# Patient Record
Sex: Female | Born: 1985 | Race: White | Hispanic: No | Smoking: Current every day smoker
Health system: Southern US, Community
[De-identification: ages and names within clinical notes are randomized; demographics above are authoritative.]

## PROBLEM LIST (undated history)

## (undated) DIAGNOSIS — J449 Chronic obstructive pulmonary disease, unspecified: Secondary | ICD-10-CM

## (undated) DIAGNOSIS — F329 Major depressive disorder, single episode, unspecified: Secondary | ICD-10-CM

## (undated) DIAGNOSIS — T07XXXA Unspecified multiple injuries, initial encounter: Secondary | ICD-10-CM

## (undated) DIAGNOSIS — F99 Mental disorder, not otherwise specified: Secondary | ICD-10-CM

## (undated) DIAGNOSIS — F32A Depression, unspecified: Secondary | ICD-10-CM

## (undated) DIAGNOSIS — K2970 Gastritis, unspecified, without bleeding: Secondary | ICD-10-CM

## (undated) DIAGNOSIS — F1020 Alcohol dependence, uncomplicated: Secondary | ICD-10-CM

## (undated) HISTORY — PX: CATARACT EXTRACTION: SUR2

## (undated) HISTORY — DX: Mental disorder, not otherwise specified: F99

## (undated) HISTORY — PX: EYE SURGERY: SHX253

## (undated) HISTORY — DX: Chronic obstructive pulmonary disease, unspecified: J44.9

## (undated) HISTORY — DX: Unspecified multiple injuries, initial encounter: T07.XXXA

## (undated) HISTORY — DX: Major depressive disorder, single episode, unspecified: F32.9

## (undated) HISTORY — DX: Alcohol dependence, uncomplicated: F10.20

## (undated) HISTORY — DX: Depression, unspecified: F32.A

---
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
## 2005-02-23 ENCOUNTER — Ambulatory Visit (HOSPITAL_COMMUNITY): Admission: RE | Admit: 2005-02-23 | Discharge: 2005-02-23 | Payer: Self-pay | Admitting: Obstetrics and Gynecology

## 2005-06-30 ENCOUNTER — Inpatient Hospital Stay (HOSPITAL_COMMUNITY): Admission: AD | Admit: 2005-06-30 | Discharge: 2005-07-03 | Payer: Self-pay | Admitting: Obstetrics and Gynecology

## 2008-01-02 ENCOUNTER — Other Ambulatory Visit: Admission: RE | Admit: 2008-01-02 | Discharge: 2008-01-02 | Payer: Self-pay | Admitting: Obstetrics & Gynecology

## 2008-04-02 ENCOUNTER — Ambulatory Visit: Payer: Self-pay | Admitting: Family Medicine

## 2008-04-02 ENCOUNTER — Inpatient Hospital Stay (HOSPITAL_COMMUNITY): Admission: AD | Admit: 2008-04-02 | Discharge: 2008-04-04 | Payer: Self-pay | Admitting: Obstetrics & Gynecology

## 2009-09-18 ENCOUNTER — Emergency Department (HOSPITAL_COMMUNITY): Admission: EM | Admit: 2009-09-18 | Discharge: 2009-09-18 | Payer: Self-pay | Admitting: Emergency Medicine

## 2009-10-26 ENCOUNTER — Emergency Department (HOSPITAL_COMMUNITY): Admission: EM | Admit: 2009-10-26 | Discharge: 2009-10-27 | Payer: Self-pay | Admitting: Emergency Medicine

## 2009-11-01 ENCOUNTER — Emergency Department (HOSPITAL_COMMUNITY): Admission: EM | Admit: 2009-11-01 | Discharge: 2009-11-01 | Payer: Self-pay | Admitting: Emergency Medicine

## 2010-05-08 ENCOUNTER — Emergency Department (HOSPITAL_COMMUNITY): Admission: EM | Admit: 2010-05-08 | Discharge: 2010-05-08 | Payer: Self-pay | Admitting: Emergency Medicine

## 2010-06-23 ENCOUNTER — Emergency Department (HOSPITAL_COMMUNITY): Admission: EM | Admit: 2010-06-23 | Discharge: 2010-06-23 | Payer: Self-pay | Admitting: Emergency Medicine

## 2010-08-29 ENCOUNTER — Emergency Department (HOSPITAL_COMMUNITY)
Admission: EM | Admit: 2010-08-29 | Discharge: 2010-08-29 | Disposition: A | Discharge status: Home / Self Care | Attending: Emergency Medicine | Admitting: Emergency Medicine

## 2010-08-29 ENCOUNTER — Emergency Department (HOSPITAL_COMMUNITY): Admit: 2010-08-29 | Discharge: 2010-08-29 | Disposition: A | Discharge status: Home / Self Care

## 2010-08-29 DIAGNOSIS — F3130 Bipolar disorder, current episode depressed, mild or moderate severity, unspecified: Secondary | ICD-10-CM | POA: Insufficient documentation

## 2010-08-29 DIAGNOSIS — Y92009 Unspecified place in unspecified non-institutional (private) residence as the place of occurrence of the external cause: Secondary | ICD-10-CM | POA: Insufficient documentation

## 2010-08-29 DIAGNOSIS — S60229A Contusion of unspecified hand, initial encounter: Secondary | ICD-10-CM | POA: Insufficient documentation

## 2010-08-29 DIAGNOSIS — W2209XA Striking against other stationary object, initial encounter: Secondary | ICD-10-CM | POA: Insufficient documentation

## 2010-08-29 DIAGNOSIS — S6990XA Unspecified injury of unspecified wrist, hand and finger(s), initial encounter: Secondary | ICD-10-CM | POA: Insufficient documentation

## 2010-08-29 DIAGNOSIS — IMO0002 Reserved for concepts with insufficient information to code with codable children: Secondary | ICD-10-CM | POA: Insufficient documentation

## 2010-08-29 DIAGNOSIS — Z79899 Other long term (current) drug therapy: Secondary | ICD-10-CM | POA: Insufficient documentation

## 2010-08-29 DIAGNOSIS — S59909A Unspecified injury of unspecified elbow, initial encounter: Secondary | ICD-10-CM | POA: Insufficient documentation

## 2010-08-29 DIAGNOSIS — M25539 Pain in unspecified wrist: Secondary | ICD-10-CM | POA: Insufficient documentation
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
## 2010-12-11 NOTE — Discharge Summary (Signed)
[facility]  Patient:    Alison Johnson, Alison Johnson Visit Number: [id number] MRN: [id number]          Service Type: PSY Location: 100 0105 02 Attending Physician:  Veneta Penton Dictated by:   Elfredia Nevins, M.D. Admit Date:  06/01/2001 Discharge Date: 06/06/2001                             Discharge Summary  DISCHARGE DIAGNOSIS:  Intentional overdose, nontoxic.  SUMMARY:  The patient was discharged to psychiatric inpatient care after an intentional overdose of calcium channel blocker, Synthroid and aleve as well as ibuprofen.  She had no toxic metabolic sequelae, and had prompt gastric decontamination on arrival to the emergency department within 1 to 2 hours of ingestion.  She was observed in the ICU overnight with serial assessments stable.  DISPOSITION:  To psychiatry as above. Dictated by:   Elfredia Nevins, M.D. Attending Physician:  Veneta Penton DD:  06/13/01 TD:  06/13/01 Job: [id number]

## 2010-12-11 NOTE — Group Therapy Note (Signed)
NAMEMAKYLAH, BOSSARD                ACCOUNT NO.:  [id number]   MEDICAL RECORD NO.:  [id number]          PATIENT TYPE:  INP   LOCATION:  LDR1                          FACILITY:  [facility]   PHYSICIAN:  Tilda Burrow, M.D. DATE OF BIRTH:  November 16, 1985   DATE OF PROCEDURE:  07/01/2005  DATE OF DISCHARGE:                                   PROGRESS NOTE   HISTORY OF PRESENT ILLNESS:  Length of first stage of labor 6 hours and 40  minutes.  Length of second stage of labor 30 minutes.  Length of third stage  labor 15 minutes.   DELIVERY NOTE:  Othel had a vacuum assisted vaginal delivery of a viable  female infant with Apgars of 9 and 9 for prolonged fetal bradycardia and  decelerations.  At a crowning station, a KIWI was placed.  Local infiltrate  given and midline episiotomy cut and vacuum assisted to the green marker on  the KIWI vac times one uterine contraction with spontaneous delivery of  head.  Upon delivery of infant, it was thoroughly suctioned, dried, cord cut  and clamped and to the mother's abdomen for newborn care.  The crown was  strong.  Color pinked well.  Movement of all extremity was noted.  Following  inspection, there is a third degree extension from the midline episiotomy.  Dr. Emelda Fear was in attendance.  Third stage of labor was actually managed  with 20 units Pitocin and 7 cc of D-5 Dilaudid at rapid rate.  Placenta was  delivered spontaneously via Shultze mechanism.  Estimated blood loss was  approximately 400 cc.  Third degree laceration was repaired by Dr. Emelda Fear  using 3-0 Vicryl and 1% lidocaine with local infiltrate.  Patient tolerated  procedure well.  Infant and mother stabilized and transferred out to  postpartum unit in stable condition.      Zerita Boers, Lanier Clam      Tilda Burrow, M.D.  Electronically Signed    DL/MEDQ  D:  05/22/2535  T:  07/01/2005  Job:  [id number]   cc:   [facility]   Francoise Schaumann. Halford Chessman  Fax: [phone]

## 2010-12-11 NOTE — Discharge Summary (Signed)
[facility]  Patient:    Alison Johnson, Alison Johnson Visit Number: [id number] MRN: [id number]          Service Type: PSY Location: 100 0105 02 Attending Physician:  Veneta Penton. Dictated by:   Veneta Penton, M.D. Admit Date:  06/01/2001 Disc. Date: 06/06/01                             Discharge Summary  REASON FOR ADMISSION:  This 25 year old white female was admitted complaining of depression status post suicide attempt via drug overdose with her mothers pills.  For further history of present illness, please see the patients psychiatric admission assessment.  PHYSICAL EXAMINATION:  At the time of admission was unremarkable with the exception of a history of removal of a cataract in her right eye and a strabismus repair as a young child.  LABORATORY EXAMINATION:  The patient underwent a laboratory work-up to rule out any medical problems contributing to her symptomatology.  A urine probe for gonorrhea and chlamydia was negative for gonorrhea and positive for chlamydia.  The patient was treated with a gram of Zithromax p.o. x 1.  Her TSH was 0.694.  Free T4 was 1.53. A UA showed small amount of hemoglobin consistent with the patient being on her menstrual period.  A routine chem panel was within normal limits.  A GGT was within normal limits.  Hepatic panel was within normal limits.  An RPR was nonreactive.  CBC showed a hemoglobin of 11.8, hematocrit of 33.5, white blood cell count of 13,000, platelet count of 429,000, and was otherwise unremarkable.  Urine pregnancy test was negative.  Salicylate level was undetectable.  Blood alcohol level undetectable.  Acetaminophen level was subtherapeutic.  A routine drug screen was negative.  HOSPITAL COURSE:  The patient on admission was psychomotor retarded with a flat affect.  Mood was depressed, anxious, irritable and angry.  Her concentration was decreased.  She showed poor impulse control.  Her  speech was coherent with a decreased rate and volume of speech and increased speech latency.  She was begun on a trial of Effexor XR and titrated up to a therapeutic range, in combination with Remeron soltabs due to extreme difficulty with sleep.  She tolerated these medications well without side effects.  At the time of discharge, she denies any homicidal or suicidal ideation.  Her affect and mood have improved.  She no longer appears to be a danger to herself or others.  She is participating in all aspects of the therapeutic treatment program and consequently is felt to have reached her maximum benefits of hospitalization and is ready for discharge to a less restricted alternative setting.  CONDITION ON DISCHARGE:  Improved  FINAL DIAGNOSIS: Axis I:    1. Major depression, single episode, severe, without psychosis.            2. Oppositional-defiant disorder.            3. Nicotine dependence.            4. Cannabis abuse by history.            5. Rule out polysubstance abuse and dependence. Axis II:   Rule out learning disorder not otherwise specified. Axis III:  Chlamydia (treated). Axis IV:   Current psychosocial stressors are severe. Axis V:    Code 20 on admission, code 30 on discharge.  FURTHER EVALUATION AND TREATMENT RECOMMENDATIONS: 1. The patient is  discharged to home. 2. She is discharged on an unrestricted level of activity and a regular diet. 3. She will follow up with her outpatient psychiatrist for all further aspects    of her psychiatric care.  As she will be following up with her outpatient    psychiatrist I will sign off on the case at this time. DISCHARGE MEDICATIONS: 1. Remeron soltabs 30 mg p.o. q.h.s. 2. Effexor XR 37.5 mg in the morning with food for 3 days, then increasing to    75 mg p.o. q.a.m. with food. Dictated by:   Veneta Penton, M.D. Attending Physician:  Veneta Penton DD:  06/06/01 TD:  06/06/01 Job: [id number]

## 2010-12-11 NOTE — H&P (Signed)
Alison Johnson, Alison Johnson                ACCOUNT NO.:  [id number]   MEDICAL RECORD NO.:  [id number]          PATIENT TYPE:  INP   LOCATION:  LDR1                          FACILITY:  [facility]   PHYSICIAN:  Tilda Burrow, M.D. DATE OF BIRTH:  15-Jun-1986   DATE OF ADMISSION:  06/30/2005  DATE OF DISCHARGE:  LH                                HISTORY & PHYSICAL   REASON FOR ADMISSION:  Pregnancy at 40 weeks and 5 days for induction of  labor for post dates.   HISTORY OF PRESENT ILLNESS:  Alison Johnson is a 25 year old gravida 1, para 0, EDC  June 25, 2005, who is in today for her routine office visit.  After  careful discussion in regards to risks and benefits of induction of labor  versus continuing the pregnancy until she went into labor, the patient and  practitioner both feel it would be in her best interest for induction of  labor.   PAST MEDICAL HISTORY:  Depression.   PAST SURGICAL HISTORY:  Right eye:  Congenital cataract.   FAMILY HISTORY:  She lives with her significant other.   PRENATAL COURSE:  Essentially uneventful.  Blood type O negative, rubella  immune, hepatitis B surface antigen negative, HIV nonreactive, HPV negative.  CHARGE nonreactive.  Pap normal.  GC/Chlamydia are negative.  AFP normal.  QBS is positive.  Repeat GC/Chlamydia at 36 weeks was negative.  A 28-week  hemoglobin 11.8, hematocrit 36.3.  One hour glucose was 91.   PHYSICAL EXAMINATION:  VITAL SIGNS:  Weight 132, blood pressure 110/60.  ABDOMEN:  There is good fetal movement noted per patient and practitioner.  Fundal height is 37 cm.  Fetal heart rate is 120-130.  Vertex presentation  is noted.  Cervix is 1-2 cm dilated, 8% and -1.   PLAN:  We are going to admit for Foley bulb induction of labor.      Zerita Boers, Lanier Clam      Tilda Burrow, M.D.  Electronically Signed    DL/MEDQ  D:  16/04/9603  T:  06/30/2005  Job:  [id number]   cc:   Francoise Schaumann. Halford Chessman  Fax: [phone]

## 2010-12-11 NOTE — H&P (Signed)
[facility]  Patient:    Alison Johnson, Alison Johnson Visit Number: [id number] MRN: [id number]          Service Type: PSY Location: 100 0105 02 Attending Physician:  Veneta Penton Dictated by:   Elfredia Nevins, M.D. Admit Date:  06/01/2001                           History and Physical  DATE OF BIRTH:  1986/02/20  CHIEF COMPLAINT:  Intentional overdose.  HISTORY OF PRESENT ILLNESS:  The patient presented to the emergency department within 15 minutes to 30 minutes, certainly inside of an hour, of ingesting some Synthroid, calcium-channel blocker, and Aleve, as well as ibuprofen.  She was under some situational stress which precipitated this episode.  She denied any homicidal ideation.  She has no previous medical problems but has had a previous psychiatric history of depression, maintained on Celexa and Atarax p.r.n.  SOCIAL HISTORY:  She is cigarette smoker positive.  Occasional alcohol use. Denies any other drug use.  She was noted to arrive by private car after she told her parents of her gesture and ingestion.  FAMILY HISTORY:  Noncontributory.  REVIEW OF SYSTEMS:  As under HPI.  Last menstrual period was two weeks ago, on time, and she uses condoms for prophylaxis.  PHYSICAL EXAMINATION:  GENERAL:  Awake, alert, tearful, and anxious.  HEENT, NECK:  No JVD or adenopathy.  Neck supple.  CHEST:  Clear.  CARDIAC:  Regular rhythm without murmur, gallop, or rub.  ABDOMEN:  Soft.  No organomegaly or mass.  EXTREMITIES:  Without clubbing, cyanosis, or edema.  NEUROLOGIC:  Normal.  LABORATORY DATA:  Negative urine drug screen.  Nontoxic aspirin and Tylenol levels.  Thyroid panel pending at the present time.  PLAN:  She is to be placed in the intensive care unit for observation ______, seizure and suicide precautions and will be seen in consultation by [facility] after medical clearance is established.Dictated by:   Elfredia Nevins, M.D. Attending Physician:  Veneta Penton DD:  06/01/01 TD:  06/01/01 Job: [id number]

## 2011-03-14 ENCOUNTER — Emergency Department (HOSPITAL_COMMUNITY)
Admission: EM | Admit: 2011-03-14 | Discharge: 2011-03-14 | Disposition: A | Discharge status: Home / Self Care | Attending: Emergency Medicine | Admitting: Emergency Medicine

## 2011-03-14 ENCOUNTER — Emergency Department (HOSPITAL_COMMUNITY)

## 2011-03-14 ENCOUNTER — Encounter: Payer: Self-pay | Admitting: *Deleted

## 2011-03-14 DIAGNOSIS — H209 Unspecified iridocyclitis: Secondary | ICD-10-CM

## 2011-03-14 DIAGNOSIS — H5710 Ocular pain, unspecified eye: Secondary | ICD-10-CM | POA: Insufficient documentation

## 2011-03-14 MED ORDER — ATROPINE SULFATE 1 % OP SOLN
1.0000 [drp] | Freq: Once | OPHTHALMIC | Status: AC
  Administered 2011-03-14: 1 [drp] via OPHTHALMIC
  Filled 2011-03-14: qty 2

## 2011-03-14 MED ORDER — KETOROLAC TROMETHAMINE 0.5 % OP SOLN
1.0000 [drp] | Freq: Once | OPHTHALMIC | Status: AC
  Administered 2011-03-14: 1 [drp] via OPHTHALMIC
  Filled 2011-03-14: qty 5

## 2011-03-14 MED ORDER — TETRACAINE HCL 0.5 % OP SOLN
2.0000 [drp] | Freq: Once | OPHTHALMIC | Status: AC
  Administered 2011-03-14: 2 [drp] via OPHTHALMIC
  Filled 2011-03-14: qty 2

## 2011-03-14 MED ORDER — ATROPINE SULFATE 1 % OP OINT: TOPICAL_OINTMENT | Freq: Once | OPHTHALMIC | Status: DC

## 2011-03-14 MED ORDER — HYDROCODONE-ACETAMINOPHEN 5-325 MG PO TABS: 1.0000 | ORAL_TABLET | ORAL | Status: AC | PRN
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
## 2011-03-14 NOTE — ED Notes (Signed)
Per Burgess Amor, PA - d/c pt with Atropine drops and given 1 drop to right eye tomorrow evening.  Pt verbalized understanding.

## 2011-04-28 LAB — RPR: RPR Ser Ql: NONREACTIVE

## 2011-04-28 LAB — CBC
HCT: 40.7
Hemoglobin: 11.9 — ABNORMAL LOW
MCHC: 33.9
MCV: 99.7
Platelets: 269
RBC: 3.48 — ABNORMAL LOW
RBC: 4.08
RDW: 13.2
RDW: 13.3

## 2011-04-28 LAB — RH IMMUNE GLOB WKUP(>/=20WKS)(NOT WOMEN'S HOSP): Fetal Screen: NEGATIVE

## 2017-08-05 ENCOUNTER — Ambulatory Visit: Admitting: Family Medicine

## 2017-09-04 ENCOUNTER — Encounter (HOSPITAL_COMMUNITY): Payer: Self-pay | Admitting: Emergency Medicine

## 2017-09-04 ENCOUNTER — Emergency Department (HOSPITAL_COMMUNITY)
Admission: EM | Admit: 2017-09-04 | Discharge: 2017-09-04 | Disposition: A | Discharge status: Home / Self Care | Payer: Medicaid Other | Attending: Emergency Medicine | Admitting: Emergency Medicine

## 2017-09-04 ENCOUNTER — Emergency Department (HOSPITAL_COMMUNITY): Payer: Medicaid Other

## 2017-09-04 ENCOUNTER — Other Ambulatory Visit: Payer: Self-pay

## 2017-09-04 DIAGNOSIS — S42294A Other nondisplaced fracture of upper end of right humerus, initial encounter for closed fracture: Secondary | ICD-10-CM | POA: Insufficient documentation

## 2017-09-04 DIAGNOSIS — Z79899 Other long term (current) drug therapy: Secondary | ICD-10-CM | POA: Insufficient documentation

## 2017-09-04 DIAGNOSIS — Y929 Unspecified place or not applicable: Secondary | ICD-10-CM | POA: Insufficient documentation

## 2017-09-04 DIAGNOSIS — F17210 Nicotine dependence, cigarettes, uncomplicated: Secondary | ICD-10-CM | POA: Diagnosis not present

## 2017-09-04 DIAGNOSIS — S4991XA Unspecified injury of right shoulder and upper arm, initial encounter: Secondary | ICD-10-CM | POA: Diagnosis present

## 2017-09-04 DIAGNOSIS — Y999 Unspecified external cause status: Secondary | ICD-10-CM | POA: Diagnosis not present

## 2017-09-04 DIAGNOSIS — W08XXXA Fall from other furniture, initial encounter: Secondary | ICD-10-CM | POA: Insufficient documentation

## 2017-09-04 DIAGNOSIS — Y939 Activity, unspecified: Secondary | ICD-10-CM | POA: Insufficient documentation

## 2017-09-04 DIAGNOSIS — S42291A Other displaced fracture of upper end of right humerus, initial encounter for closed fracture: Secondary | ICD-10-CM

## 2017-09-04 MED ORDER — OXYCODONE-ACETAMINOPHEN 5-325 MG PO TABS: 1.0000 | ORAL_TABLET | ORAL | 0 refills | Status: DC | PRN

## 2017-09-04 MED ORDER — OXYCODONE-ACETAMINOPHEN 5-325 MG PO TABS
1.0000 | ORAL_TABLET | Freq: Once | ORAL | Status: AC
  Administered 2017-09-04: 1 via ORAL
  Filled 2017-09-04: qty 1

## 2017-09-04 MED ORDER — IBUPROFEN 400 MG PO TABS: 600.0000 mg | ORAL_TABLET | Freq: Four times a day (QID) | ORAL | 0 refills | Status: DC | PRN

## 2017-09-04 NOTE — ED Triage Notes (Signed)
Patient c/o right arm/humerous pain. Per patient was standing on step stool to get a can of peaches, when stool came out from under her causing her to fall and land on right arm. Denies hitting head or LOC. CNS intact.

## 2017-09-04 NOTE — ED Notes (Signed)
Pt reports standing on a stool, falling off onto arm  R arm in sling by triage  Ice/meds as pt crying and shouting

## 2017-09-04 NOTE — ED Provider Notes (Signed)
[facility] EMERGENCY DEPARTMENT Provider Note   CSN: [id number] Arrival date & time: 09/04/17  1251     History   Chief Complaint Chief Complaint  Patient presents with  . Arm Pain    HPI Alison Johnson is a 32 y.o. female.  HPI  Alison Johnson is a 32 y.o. female who presents to the Emergency Department complaining of right shoulder pain secondary to a chemical fall that occurred last evening.  She states that she was standing on a step stool when the stool overturned causing her to fall on her right side.  She complains of pain to her shoulder associated with movement.  She states the pain is worse today.  She is tried over-the-counter pain relievers without improvement.  She denies head injury, LOC, headache or dizziness, neck pain, numbness or tingling of her fingertips.   Past Medical History:  Diagnosis Date  . Cataract     There are no active problems to display for this patient.   Past Surgical History:  Procedure Laterality Date  . EYE SURGERY      OB History    No data available       Home Medications    Prior to Admission medications   Medication Sig Start Date End Date Taking? Authorizing Provider  acetaminophen (TYLENOL) 500 MG tablet Take 500 mg by mouth every 6 (six) hours as needed. For pain     [provider]  ibuprofen (ADVIL,MOTRIN) 200 MG tablet Take 200 mg by mouth every 6 (six) hours as needed. For pain     [provider]  venlafaxine (EFFEXOR) 100 MG tablet Take 75 mg by mouth 1 day or 1 dose.      [provider]  venlafaxine (EFFEXOR-XR) 75 MG 24 hr capsule Take 75 mg by mouth daily.      [provider]    Family History No family history on file.  Social History Social History   Tobacco Use  . Smoking status: Current Every Day Smoker    Packs/day: 1.00    Types: Cigarettes  . Smokeless tobacco: Never Used  Substance Use Topics  . Alcohol use: Yes    Comment: occationally  . Drug use:  Yes    Types: Marijuana     Allergies   Codeine   Review of Systems Review of Systems  Constitutional: Negative for chills and fever.  Respiratory: Negative for chest tightness.   Cardiovascular: Negative for chest pain.  Gastrointestinal: Negative for nausea and vomiting.  Musculoskeletal: Positive for arthralgias (Right shoulder pain). Negative for back pain, joint swelling and neck pain.  Skin: Negative for color change and wound.  Neurological: Negative for dizziness, syncope, weakness, numbness and headaches.  All other systems reviewed and are negative.    Physical Exam Updated Vital Signs BP 101/71 (BP Location: Left Arm)   Pulse (!) 101 Comment: pt crying  Temp 98.4 F (36.9 C) (Oral)   Resp 20   Ht 5\' 2"  (1.575 m)   Wt 49.9 kg (110 lb)   SpO2 98%   BMI 20.12 kg/m   Physical Exam  Constitutional: She is oriented to person, place, and time. She appears well-developed and well-nourished. No distress.  HENT:  Head: Atraumatic.  Neck: Normal range of motion. Neck supple.  Cardiovascular: Normal rate, regular rhythm and intact distal pulses.  No murmur heard. Pulmonary/Chest: Effort normal and breath sounds normal. She exhibits no tenderness.  Musculoskeletal: She exhibits tenderness. She exhibits no edema  or deformity.  Tenderness to palpation of the right shoulder.  Patient unable to abduct right arm due to level of pain.  She is guarding the right shoulder.  No tenderness distal to the shoulder, no bony deformities.  Neurological: She is alert and oriented to person, place, and time. No sensory deficit.  Skin: Skin is warm. Capillary refill takes less than 2 seconds. No rash noted.  Nursing note and vitals reviewed.    ED Treatments / Results  Labs (all labs ordered are listed, but only abnormal results are displayed) Labs Reviewed - No data to display  EKG  EKG Interpretation None       Radiology Dg Humerus Right  Result Date:  09/04/2017 CLINICAL DATA:  Pain following fall EXAM: RIGHT HUMERUS - 2+ VIEW COMPARISON:  None. FINDINGS: Frontal and lateral views were obtained. There is a transversely oriented comminuted fracture of the proximal humeral metaphysis with impaction at the fracture site. No other fracture evident. No dislocation. Joint spaces appear normal. No erosive change. IMPRESSION: Impacted, comminuted fracture proximal humeral metaphysis. Overall alignment is near anatomic. No other fractures. No dislocation. No appreciable arthropathy. Electronically Signed   By: Bretta BangWilliam  Woodruff III M.D.   On: 09/04/2017 14:12    Procedures Procedures (including critical care time    Medications Ordered in ED Medications  oxyCODONE-acetaminophen (PERCOCET/ROXICET) 5-325 MG per tablet 1 tablet (1 tablet Oral Given 09/04/17 1544)     Initial Impression / Assessment and Plan / ED Course  I have reviewed the triage vital signs and the nursing notes.  Pertinent labs & imaging results that were available during my care of the patient were reviewed by me and considered in my medical decision making (see chart for details).     Pain improved with pain medication here and application of sling by nursing staff.  Remains neurovascularly intact.  Discussed x-ray findings with the patient.  She agrees to treatment plan with pain medication, ibuprofen, and close orthopedic follow-up.  Final Clinical Impressions(s) / ED Diagnoses   Final diagnoses:  Closed fracture of head of right humerus, initial encounter    ED Discharge Orders    None       Pauline Ausriplett, Rozella Servello, PA-C 09/04/17 1655    Vanetta MuldersZackowski, Scott, MD 09/05/17 [phone]

## 2017-09-04 NOTE — Discharge Instructions (Signed)
Apply ice packs on and off to your shoulder.  Keep your right arm in the sling.  Call the orthopedic doctor listed to arrange a follow-up appointment for this week.

## 2017-09-06 MED FILL — Oxycodone w/ Acetaminophen Tab 5-325 MG: ORAL | Qty: 6 | Status: AC

## 2017-09-07 ENCOUNTER — Ambulatory Visit (INDEPENDENT_AMBULATORY_CARE_PROVIDER_SITE_OTHER): Payer: Self-pay | Admitting: Orthopaedic Surgery

## 2017-09-07 ENCOUNTER — Encounter: Payer: Self-pay | Admitting: Orthopaedic Surgery

## 2017-09-07 VITALS — BP 108/69 | HR 86 | Temp 97.4°F | Ht 63.0 in | Wt 93.0 lb

## 2017-09-07 DIAGNOSIS — S42294A Other nondisplaced fracture of upper end of right humerus, initial encounter for closed fracture: Secondary | ICD-10-CM

## 2017-09-07 NOTE — Progress Notes (Signed)
Subjective:    Patient ID: Alison Johnson, female    DOB: [birth year], 32 y.o.   MRN: [id number]  HPI She fell and hurt her right shoulder.  She was seen in the ER 09-04-17.  X-rays showed: IMPRESSION: Impacted, comminuted fracture proximal humeral metaphysis. Overall alignment is near anatomic. No other fractures. No dislocation. No appreciable arthropathy.  She was given a sling, ibuprofen Rx and pain medicine Rx.  She is doing well with them.  She has no other injury.  I have reviewed the ER records, the X-rays and the reports.   Review of Systems  Eyes: Positive for visual disturbance.       Congenital blindness right eye.  Musculoskeletal: Positive for arthralgias and joint swelling.   Past Medical History:  Diagnosis Date  . Cataract     Past Surgical History:  Procedure Laterality Date  . EYE SURGERY      Current Outpatient Medications on File Prior to Visit  Medication Sig Dispense Refill  . acetaminophen (TYLENOL) 500 MG tablet Take 500 mg by mouth every 6 (six) hours as needed. For pain     . ibuprofen (ADVIL,MOTRIN) 400 MG tablet Take 1.5 tablets (600 mg total) by mouth every 6 (six) hours as needed. Take with food 21 tablet 0  . oxyCODONE-acetaminophen (PERCOCET/ROXICET) 5-325 MG tablet Take 1 tablet by mouth every 4 (four) hours as needed. 6 tablet 0  . oxyCODONE-acetaminophen (PERCOCET/ROXICET) 5-325 MG tablet Take 1 tablet by mouth every 4 (four) hours as needed. 6 tablet 0  . venlafaxine (EFFEXOR) 100 MG tablet Take 75 mg by mouth 1 day or 1 dose.      . venlafaxine (EFFEXOR-XR) 75 MG 24 hr capsule Take 75 mg by mouth daily.       No current facility-administered medications on file prior to visit.     Social History   Socioeconomic History  . Marital status: Divorced    Spouse name: Not on file  . Number of children: Not on file  . Years of education: Not on file  . Highest education level: Not on file  Social Needs  . Financial resource strain:  Not on file  . Food insecurity - worry: Not on file  . Food insecurity - inability: Not on file  . Transportation needs - medical: Not on file  . Transportation needs - non-medical: Not on file  Occupational History  . Not on file  Tobacco Use  . Smoking status: Current Every Day Smoker    Packs/day: 1.00    Types: Cigarettes  . Smokeless tobacco: Never Used  Substance and Sexual Activity  . Alcohol use: Yes    Comment: occationally  . Drug use: Yes    Types: Marijuana  . Sexual activity: Not on file  Other Topics Concern  . Not on file  Social History Narrative  . Not on file    History reviewed. No pertinent family history.  BP 108/69   Pulse 86   Temp (!) 97.4 F (36.3 C)   Ht 5\' 3"  (1.6 m)   Wt 93 lb (42.2 kg)   BMI 16.47 kg/m       Objective:   Physical Exam  Constitutional: She is oriented to person, place, and time. She appears well-developed and well-nourished.  HENT:  Head: Normocephalic and atraumatic.  Eyes:  Right eye with deformity, pupil fully dilated, no vision.  Left eye normal.  Neck: Normal range of motion. Neck supple.  Cardiovascular: Normal rate, regular  rhythm and intact distal pulses.  Pulmonary/Chest: Effort normal.  Abdominal: Soft.  Neurological: She is alert and oriented to person, place, and time. She displays normal reflexes. No cranial nerve deficit. She exhibits normal muscle tone. Coordination normal.  Skin: Skin is warm and dry.  Psychiatric: She has a normal mood and affect. Her behavior is normal. Judgment and thought content normal.  Vitals reviewed.     Assessment & Plan:   Encounter Diagnosis  Name Primary?  . Other closed nondisplaced fracture of proximal end of right humerus, initial encounter Yes   Her sling was adjusted.  Continue her present pain medicine.  Return in one week.  X-rays of the right shoulder on return.  Call if any problem.  Precautions discussed.   Electronically Signed Darreld Mclean,  MD 2/13/201910:13 AM

## 2017-09-12 ENCOUNTER — Telehealth: Payer: Self-pay | Admitting: Orthopaedic Surgery

## 2017-09-12 MED ORDER — HYDROCODONE-ACETAMINOPHEN 5-325 MG PO TABS: ORAL_TABLET | ORAL | 0 refills | Status: DC

## 2017-09-12 NOTE — Telephone Encounter (Signed)
Patient is requesting refill on pain medication.   Oxycodone-Acetaminophen  5/325 mg    PATIENT USES [facility]

## 2017-09-13 ENCOUNTER — Telehealth: Payer: Self-pay | Admitting: Orthopaedic Surgery

## 2017-09-13 NOTE — Telephone Encounter (Signed)
Patient called in upset because her prescription had been changed from Oxycodone to Hydrocodone. She stated she was allergic to Hydrocodiene and that it was in her hospital chart. I tried to explain to her that the doctor prescribed what he thought was best for her. I told her that she has an appointment tomorrow morning(09/14/17) and she could discuss this with the doctor. She was very upset and spoke very loud about how she was allergic to Hydrocodiene and hung up on me while I was speaking.

## 2017-09-14 ENCOUNTER — Ambulatory Visit (INDEPENDENT_AMBULATORY_CARE_PROVIDER_SITE_OTHER): Payer: Self-pay

## 2017-09-14 ENCOUNTER — Encounter: Payer: Self-pay | Admitting: Orthopaedic Surgery

## 2017-09-14 ENCOUNTER — Ambulatory Visit: Payer: Self-pay | Admitting: Orthopaedic Surgery

## 2017-09-14 VITALS — BP 115/65 | Temp 98.0°F | Ht 63.0 in | Wt 98.0 lb

## 2017-09-14 DIAGNOSIS — S42294D Other nondisplaced fracture of upper end of right humerus, subsequent encounter for fracture with routine healing: Secondary | ICD-10-CM

## 2017-09-14 NOTE — Progress Notes (Signed)
CC:  My shoulder still hurts   She is wearing her sling and using ice.  She wants oxycodone.  I called in hydrocodone.  She says she cannot take that.  I told her to try the hydrocodone.  If she should have a reaction, then I can change to Toradol.    NV intact.  Sling OK.  X-rays were done, reported separately.  Encounter Diagnosis  Name Primary?  . Other closed nondisplaced fracture of proximal end of right humerus with routine healing, subsequent encounter Yes   Return in two weeks.  X-rays on return.  Call if any problem.  Precautions discussed.   Electronically Signed Darreld McleanWayne Legacy Lacivita, MD 2/20/20199:50 AM

## 2017-09-14 NOTE — Patient Instructions (Signed)
Steps to Quit Smoking Smoking tobacco can be bad for your health. It can also affect almost every organ in your body. Smoking puts you and people around you at risk for many serious long-lasting (chronic) diseases. Quitting smoking is hard, but it is one of the best things that you can do for your health. It is never too late to quit. What are the benefits of quitting smoking? When you quit smoking, you lower your risk for getting serious diseases and conditions. They can include:  Lung cancer or lung disease.  Heart disease.  Stroke.  Heart attack.  Not being able to have children (infertility).  Weak bones (osteoporosis) and broken bones (fractures).  If you have coughing, wheezing, and shortness of breath, those symptoms may get better when you quit. You may also get sick less often. If you are pregnant, quitting smoking can help to lower your chances of having a baby of low birth weight. What can I do to help me quit smoking? Talk with your doctor about what can help you quit smoking. Some things you can do (strategies) include:  Quitting smoking totally, instead of slowly cutting back how much you smoke over a period of time.  Going to in-person counseling. You are more likely to quit if you go to many counseling sessions.  Using resources and support systems, such as: ? Online chats with a counselor. ? Phone quitlines. ? Printed self-help materials. ? Support groups or group counseling. ? Text messaging programs. ? Mobile phone apps or applications.  Taking medicines. Some of these medicines may have nicotine in them. If you are pregnant or breastfeeding, do not take any medicines to quit smoking unless your doctor says it is okay. Talk with your doctor about counseling or other things that can help you.  Talk with your doctor about using more than one strategy at the same time, such as taking medicines while you are also going to in-person counseling. This can help make  quitting easier. What things can I do to make it easier to quit? Quitting smoking might feel very hard at first, but there is a lot that you can do to make it easier. Take these steps:  Talk to your family and friends. Ask them to support and encourage you.  Call phone quitlines, reach out to support groups, or work with a counselor.  Ask people who smoke to not smoke around you.  Avoid places that make you want (trigger) to smoke, such as: ? Bars. ? Parties. ? Smoke-break areas at work.  Spend time with people who do not smoke.  Lower the stress in your life. Stress can make you want to smoke. Try these things to help your stress: ? Getting regular exercise. ? Deep-breathing exercises. ? Yoga. ? Meditating. ? Doing a body scan. To do this, close your eyes, focus on one area of your body at a time from head to toe, and notice which parts of your body are tense. Try to relax the muscles in those areas.  Download or buy apps on your mobile phone or tablet that can help you stick to your quit plan. There are many free apps, such as QuitGuide from the CDC (Centers for Disease Control and Prevention). You can find more support from smokefree.gov and other websites.  This information is not intended to replace advice given to you by your health care provider. Make sure you discuss any questions you have with your health care provider. Document Released: 05/08/2009 Document   Revised: 03/09/2016 Document Reviewed: 11/26/2014 Elsevier Interactive Patient Education  2018 Elsevier Inc.  

## 2017-09-22 DIAGNOSIS — S42201D Unspecified fracture of upper end of right humerus, subsequent encounter for fracture with routine healing: Secondary | ICD-10-CM | POA: Insufficient documentation

## 2017-09-27 ENCOUNTER — Ambulatory Visit (INDEPENDENT_AMBULATORY_CARE_PROVIDER_SITE_OTHER): Payer: Self-pay | Admitting: Orthopedic Surgery

## 2017-09-27 ENCOUNTER — Ambulatory Visit (INDEPENDENT_AMBULATORY_CARE_PROVIDER_SITE_OTHER): Payer: Self-pay

## 2017-09-27 VITALS — BP 99/78 | HR 105 | Ht 63.0 in | Wt 98.0 lb

## 2017-09-27 DIAGNOSIS — S42294D Other nondisplaced fracture of upper end of right humerus, subsequent encounter for fracture with routine healing: Secondary | ICD-10-CM

## 2017-09-27 MED ORDER — HYDROCODONE-ACETAMINOPHEN 5-325 MG PO TABS: ORAL_TABLET | ORAL | 0 refills | Status: DC

## 2017-09-27 NOTE — Patient Instructions (Addendum)
Shoulder Range of Motion Exercises Shoulder range of motion (ROM) exercises are designed to keep the shoulder moving freely. They are often recommended for people who have shoulder pain. Phase 1 exercises When you are able, do this exercise 5-6 days per week, or as told by your health care provider. Work toward doing 2 sets of 10 swings. Pendulum Exercise How To Do This Exercise Lying Down 1. Lie face-down on a bed with your abdomen close to the side of the bed. 2. Let your arm hang over the side of the bed. 3. Relax your shoulder, arm, and hand. 4. Slowly and gently swing your arm forward and back. Do not use your neck muscles to swing your arm. They should be relaxed. If you are struggling to swing your arm, have someone gently swing it for you. When you do this exercise for the first time, swing your arm at a 15 degree angle for 15 seconds, or swing your arm 10 times. As pain lessens over time, increase the angle of the swing to 30-45 degrees. 5. Repeat steps 1-4 with the other arm.  How To Do This Exercise While Standing 1. Stand next to a sturdy chair or table and hold on to it with your hand. 1. Bend forward at the waist. 2. Bend your knees slightly. 3. Relax your other arm and let it hang limp. 4. Relax the shoulder blade of the arm that is hanging and let it drop. 5. While keeping your shoulder relaxed, use body motion to swing your arm in small circles. The first time you do this exercise, swing your arm for about 30 seconds or 10 times. When you do it next time, swing your arm for a little longer. 6. Stand up tall and relax. 7. Repeat steps 1-7, this time changing the direction of the circles. 2. Repeat steps 1-8 with the other arm.

## 2017-09-27 NOTE — Progress Notes (Signed)
Fracture care follow-up  Chief Complaint  Patient presents with  . Follow-up    Recheck on righ tshoulder fracture, DOI 09-04-17.    Encounter Diagnosis  Name Primary?  . Other closed nondisplaced fracture of proximal end of right humerus with routine healing, subsequent encounter 09/04/17 Yes        Current Outpatient Medications:  .  acetaminophen (TYLENOL) 500 MG tablet, Take 500 mg by mouth every 6 (six) hours as needed. For pain , Disp: , Rfl:  .  HYDROcodone-acetaminophen (NORCO/VICODIN) 5-325 MG tablet, One tablet every four hours as needed for acute pain.  Limit of five days per [place] statue., Disp: 30 tablet, Rfl: 0 .  ibuprofen (ADVIL,MOTRIN) 400 MG tablet, Take 1.5 tablets (600 mg total) by mouth every 6 (six) hours as needed. Take with food, Disp: 21 tablet, Rfl: 0 .  venlafaxine (EFFEXOR) 100 MG tablet, Take 75 mg by mouth 1 day or 1 dose.  , Disp: , Rfl:  .  venlafaxine (EFFEXOR-XR) 75 MG 24 hr capsule, Take 75 mg by mouth daily.  , Disp: , Rfl:   BP 99/78   Pulse (!) 105   Ht 5\' 3"  (1.6 m)   Wt 98 lb (44.5 kg)   BMI 17.36 kg/m   Physical Exam  Constitutional: She is oriented to person, place, and time. She appears well-developed and well-nourished. No distress.  Neurological: She is alert and oriented to person, place, and time.  Skin: Skin is warm and dry. Capillary refill takes less than 2 seconds. She is not diaphoretic.  Psychiatric: She has a normal mood and affect.     Xrays: X-ray ordered  X-ray shows slightly angulated proximal humerus fracture appears stable healing compared to previous x-rays  Plan  Start pendulum exercises  3 weeks return for recheck on shoulder motion and advance exercises at that time

## 2017-10-18 ENCOUNTER — Encounter: Payer: Self-pay | Admitting: Orthopedic Surgery

## 2017-10-18 ENCOUNTER — Ambulatory Visit: Payer: Self-pay | Admitting: Orthopedic Surgery

## 2017-11-03 ENCOUNTER — Ambulatory Visit: Payer: Self-pay | Admitting: Orthopedic Surgery

## 2017-11-09 ENCOUNTER — Ambulatory Visit: Payer: Self-pay | Admitting: Orthopedic Surgery

## 2017-11-09 ENCOUNTER — Encounter: Payer: Self-pay | Admitting: Orthopedic Surgery

## 2017-11-09 VITALS — BP 105/71 | HR 130 | Ht 63.0 in | Wt 92.0 lb

## 2017-11-09 DIAGNOSIS — S42294D Other nondisplaced fracture of upper end of right humerus, subsequent encounter for fracture with routine healing: Secondary | ICD-10-CM

## 2017-11-09 NOTE — Progress Notes (Signed)
Fracture care follow-up  Chief Complaint  Patient presents with  . Arm Injury    humerus fracture right 09/04/17    Encounter Diagnosis  Name Primary?  . Other closed nondisplaced fracture of proximal end of right humerus with routine healing, subsequent encounter 09/04/17 Yes    Patient of Dr. Hilda LiasKeeling self-pay but she is doing well with home exercises she has active flexion 120 degrees active abduction 80 degrees with some scapular substitution passive range of motion is a little bit better    Current Outpatient Medications:  .  acetaminophen (TYLENOL) 500 MG tablet, Take 500 mg by mouth every 6 (six) hours as needed. For pain , Disp: , Rfl:  .  HYDROcodone-acetaminophen (NORCO/VICODIN) 5-325 MG tablet, One tablet every four hours as needed for acute pain.  Limit of five days per [place] statue., Disp: 42 tablet, Rfl: 0 .  ibuprofen (ADVIL,MOTRIN) 400 MG tablet, Take 1.5 tablets (600 mg total) by mouth every 6 (six) hours as needed. Take with food, Disp: 21 tablet, Rfl: 0 .  venlafaxine (EFFEXOR-XR) 75 MG 24 hr capsule, Take 75 mg by mouth daily.  , Disp: , Rfl:   BP 105/71   Pulse (!) 130   Ht 5\' 3"  (1.6 m)   Wt 92 lb (41.7 kg)   BMI 16.30 kg/m      Xrays: No new x-rays.  Continue active assisted range of motion exercises follow-up 6 weeks see Dr. Hilda LiasKeeling

## 2017-11-21 ENCOUNTER — Emergency Department (HOSPITAL_COMMUNITY)
Admission: EM | Admit: 2017-11-21 | Discharge: 2017-11-21 | Disposition: A | Discharge status: Home / Self Care | Payer: Medicaid Other | Attending: Emergency Medicine | Admitting: Emergency Medicine

## 2017-11-21 ENCOUNTER — Encounter (HOSPITAL_COMMUNITY): Payer: Self-pay | Admitting: Emergency Medicine

## 2017-11-21 ENCOUNTER — Other Ambulatory Visit: Payer: Self-pay

## 2017-11-21 DIAGNOSIS — Z5321 Procedure and treatment not carried out due to patient leaving prior to being seen by health care provider: Secondary | ICD-10-CM | POA: Insufficient documentation

## 2017-11-21 DIAGNOSIS — R1110 Vomiting, unspecified: Secondary | ICD-10-CM | POA: Diagnosis not present

## 2017-11-21 DIAGNOSIS — R531 Weakness: Secondary | ICD-10-CM | POA: Diagnosis not present

## 2017-11-21 NOTE — ED Notes (Signed)
Called no answer

## 2017-11-21 NOTE — ED Triage Notes (Signed)
Pt c/o weakness and vomiting x 2 weeks. Pt states she has been 3 40oz beers a day for the last 2 weeks due to her father passing away.

## 2017-11-23 ENCOUNTER — Encounter (HOSPITAL_COMMUNITY): Payer: Self-pay | Admitting: Emergency Medicine

## 2017-11-23 ENCOUNTER — Other Ambulatory Visit: Payer: Self-pay

## 2017-11-23 ENCOUNTER — Observation Stay (HOSPITAL_COMMUNITY)
Admission: EM | Admit: 2017-11-23 | Discharge: 2017-11-25 | Disposition: A | Discharge status: Home / Self Care | Payer: Medicaid Other | Attending: Internal Medicine | Admitting: Internal Medicine

## 2017-11-23 DIAGNOSIS — D531 Other megaloblastic anemias, not elsewhere classified: Secondary | ICD-10-CM | POA: Diagnosis not present

## 2017-11-23 DIAGNOSIS — E872 Acidosis, unspecified: Secondary | ICD-10-CM | POA: Diagnosis present

## 2017-11-23 DIAGNOSIS — E876 Hypokalemia: Secondary | ICD-10-CM | POA: Insufficient documentation

## 2017-11-23 DIAGNOSIS — E440 Moderate protein-calorie malnutrition: Secondary | ICD-10-CM | POA: Insufficient documentation

## 2017-11-23 DIAGNOSIS — K2970 Gastritis, unspecified, without bleeding: Principal | ICD-10-CM | POA: Insufficient documentation

## 2017-11-23 DIAGNOSIS — F17210 Nicotine dependence, cigarettes, uncomplicated: Secondary | ICD-10-CM | POA: Diagnosis not present

## 2017-11-23 DIAGNOSIS — R112 Nausea with vomiting, unspecified: Secondary | ICD-10-CM | POA: Diagnosis present

## 2017-11-23 DIAGNOSIS — E43 Unspecified severe protein-calorie malnutrition: Secondary | ICD-10-CM

## 2017-11-23 DIAGNOSIS — Z7289 Other problems related to lifestyle: Secondary | ICD-10-CM | POA: Diagnosis present

## 2017-11-23 DIAGNOSIS — D7589 Other specified diseases of blood and blood-forming organs: Secondary | ICD-10-CM | POA: Diagnosis present

## 2017-11-23 DIAGNOSIS — F1010 Alcohol abuse, uncomplicated: Secondary | ICD-10-CM | POA: Insufficient documentation

## 2017-11-23 DIAGNOSIS — Z789 Other specified health status: Secondary | ICD-10-CM

## 2017-11-23 DIAGNOSIS — R1110 Vomiting, unspecified: Secondary | ICD-10-CM | POA: Diagnosis present

## 2017-11-23 LAB — CBC WITH DIFFERENTIAL/PLATELET
Basophils Absolute: 0 10*3/uL (ref 0.0–0.1)
Basophils Relative: 0 %
Eosinophils Absolute: 0 10*3/uL (ref 0.0–0.7)
Eosinophils Relative: 0 %
HCT: 36.1 % (ref 36.0–46.0)
Hemoglobin: 13.4 g/dL (ref 12.0–15.0)
Lymphocytes Relative: 11 %
Lymphs Abs: 1.4 10*3/uL (ref 0.7–4.0)
MCH: 41.5 pg — ABNORMAL HIGH (ref 26.0–34.0)
MCHC: 37.1 g/dL — ABNORMAL HIGH (ref 30.0–36.0)
MCV: 111.8 fL — ABNORMAL HIGH (ref 78.0–100.0)
Monocytes Absolute: 0.9 10*3/uL (ref 0.1–1.0)
Monocytes Relative: 7 %
Neutro Abs: 10.5 10*3/uL — ABNORMAL HIGH (ref 1.7–7.7)
Neutrophils Relative %: 82 %
Platelets: 346 10*3/uL (ref 150–400)
RBC: 3.23 MIL/uL — ABNORMAL LOW (ref 3.87–5.11)
RDW: 15.4 % (ref 11.5–15.5)
WBC: 12.8 10*3/uL — ABNORMAL HIGH (ref 4.0–10.5)

## 2017-11-23 LAB — LACTIC ACID, PLASMA
Lactic Acid, Venous: 1.6 mmol/L (ref 0.5–1.9)
Lactic Acid, Venous: 1.4 mmol/L (ref 0.5–1.9)

## 2017-11-23 LAB — RAPID URINE DRUG SCREEN, HOSP PERFORMED
AMPHETAMINES: NOT DETECTED
BARBITURATES: NOT DETECTED
BENZODIAZEPINES: NOT DETECTED
Cocaine: NOT DETECTED
Opiates: NOT DETECTED
TETRAHYDROCANNABINOL: POSITIVE — AB

## 2017-11-23 LAB — URINALYSIS, ROUTINE W REFLEX MICROSCOPIC
Glucose, UA: NEGATIVE mg/dL
Ketones, ur: 20 mg/dL — AB
Nitrite: NEGATIVE
Protein, ur: 30 mg/dL — AB
Specific Gravity, Urine: 1.029 (ref 1.005–1.030)
pH: 5 (ref 5.0–8.0)

## 2017-11-23 LAB — BASIC METABOLIC PANEL
Anion gap: 14 (ref 5–15)
BUN: 11 mg/dL (ref 6–20)
CO2: 26 mmol/L (ref 22–32)
CREATININE: 0.58 mg/dL (ref 0.44–1.00)
Calcium: 7.1 mg/dL — ABNORMAL LOW (ref 8.9–10.3)
Chloride: 86 mmol/L — ABNORMAL LOW (ref 101–111)
Glucose, Bld: 99 mg/dL (ref 65–99)
POTASSIUM: 2.3 mmol/L — AB (ref 3.5–5.1)
SODIUM: 126 mmol/L — AB (ref 135–145)

## 2017-11-23 LAB — COMPREHENSIVE METABOLIC PANEL
ALT: 39 U/L (ref 14–54)
AST: 69 U/L — ABNORMAL HIGH (ref 15–41)
Albumin: 4.1 g/dL (ref 3.5–5.0)
Alkaline Phosphatase: 111 U/L (ref 38–126)
Anion gap: >20 — ABNORMAL HIGH (ref 5–15)
BUN: 15 mg/dL (ref 6–20)
CO2: 32 mmol/L (ref 22–32)
Calcium: 9.7 mg/dL (ref 8.9–10.3)
Chloride: 72 mmol/L — ABNORMAL LOW (ref 101–111)
Creatinine, Ser: 0.73 mg/dL (ref 0.44–1.00)
GFR calc Af Amer: >60 mL/min (ref >60)
GFR calc non Af Amer: >60 mL/min (ref >60)
Glucose, Bld: 94 mg/dL (ref 65–99)
Potassium: <2 mmol/L — CL (ref 3.5–5.1)
Sodium: 128 mmol/L — ABNORMAL LOW (ref 135–145)
Total Bilirubin: 5.5 mg/dL — ABNORMAL HIGH (ref 0.3–1.2)
Total Protein: 7.6 g/dL (ref 6.5–8.1)

## 2017-11-23 LAB — MAGNESIUM: Magnesium: 2.1 mg/dL (ref 1.7–2.4)

## 2017-11-23 LAB — ACETAMINOPHEN LEVEL

## 2017-11-23 LAB — PREGNANCY, URINE: Preg Test, Ur: NEGATIVE

## 2017-11-23 LAB — SALICYLATE LEVEL: Salicylate Lvl: <7 mg/dL (ref 2.8–30.0)

## 2017-11-23 LAB — LIPASE, BLOOD: Lipase: 35 U/L (ref 11–51)

## 2017-11-23 MED ORDER — ONDANSETRON HCL 4 MG/2ML IJ SOLN: 4.0000 mg | Freq: Four times a day (QID) | INTRAMUSCULAR | Status: DC | PRN

## 2017-11-23 MED ORDER — POTASSIUM CHLORIDE CRYS ER 20 MEQ PO TBCR
40.0000 meq | EXTENDED_RELEASE_TABLET | Freq: Two times a day (BID) | ORAL | Status: DC
  Administered 2017-11-23: 40 meq via ORAL
  Filled 2017-11-23: qty 2

## 2017-11-23 MED ORDER — THIAMINE HCL 100 MG/ML IJ SOLN
Freq: Once | INTRAVENOUS | Status: AC
  Administered 2017-11-23: 20:00:00 via INTRAVENOUS
  Filled 2017-11-23: qty 1000

## 2017-11-23 MED ORDER — M.V.I. ADULT IV INJ
INJECTION | INTRAVENOUS | Status: AC
  Filled 2017-11-23: qty 10

## 2017-11-23 MED ORDER — ONDANSETRON HCL 4 MG/2ML IJ SOLN
4.0000 mg | Freq: Once | INTRAMUSCULAR | Status: AC
  Administered 2017-11-23: 4 mg via INTRAVENOUS
  Filled 2017-11-23: qty 2

## 2017-11-23 MED ORDER — LACTATED RINGERS IV BOLUS
1000.0000 mL | Freq: Once | INTRAVENOUS | Status: AC
  Administered 2017-11-23: 1000 mL via INTRAVENOUS

## 2017-11-23 MED ORDER — THIAMINE HCL 100 MG/ML IJ SOLN
INTRAMUSCULAR | Status: AC
  Filled 2017-11-23: qty 2

## 2017-11-23 MED ORDER — ONDANSETRON 4 MG PO TBDP
ORAL_TABLET | ORAL | Status: AC
Start: 2017-11-23 — End: 2017-11-24
  Filled 2017-11-23: qty 1

## 2017-11-23 MED ORDER — MAGNESIUM SULFATE 2 GM/50ML IV SOLN
2.0000 g | Freq: Once | INTRAVENOUS | Status: AC
  Administered 2017-11-23: 2 g via INTRAVENOUS
  Filled 2017-11-23: qty 50

## 2017-11-23 MED ORDER — POTASSIUM CHLORIDE 10 MEQ/100ML IV SOLN
10.0000 meq | INTRAVENOUS | Status: AC
  Administered 2017-11-23 (×3): 10 meq via INTRAVENOUS
  Filled 2017-11-23 (×4): qty 100

## 2017-11-23 MED ORDER — FOLIC ACID 5 MG/ML IJ SOLN
INTRAMUSCULAR | Status: AC
Start: 2017-11-23 — End: ?
  Filled 2017-11-23: qty 0.2

## 2017-11-23 MED ORDER — GI COCKTAIL ~~LOC~~: 30.0000 mL | Freq: Three times a day (TID) | ORAL | Status: DC | PRN

## 2017-11-23 MED ORDER — SODIUM CHLORIDE 0.9 % IV BOLUS
1000.0000 mL | Freq: Once | INTRAVENOUS | Status: AC
  Administered 2017-11-23: 1000 mL via INTRAVENOUS

## 2017-11-23 MED ORDER — POTASSIUM CHLORIDE CRYS ER 20 MEQ PO TBCR
60.0000 meq | EXTENDED_RELEASE_TABLET | Freq: Once | ORAL | Status: AC
  Administered 2017-11-23: 60 meq via ORAL
  Filled 2017-11-23: qty 3

## 2017-11-23 MED ORDER — SUCRALFATE 1 GM/10ML PO SUSP
1.0000 g | Freq: Three times a day (TID) | ORAL | Status: DC
  Administered 2017-11-24 – 2017-11-25 (×3): 1 g via ORAL
  Filled 2017-11-23 (×3): qty 10

## 2017-11-23 MED ORDER — ONDANSETRON HCL 4 MG PO TABS: 4.0000 mg | ORAL_TABLET | Freq: Four times a day (QID) | ORAL | Status: DC | PRN

## 2017-11-23 MED ORDER — FAMOTIDINE IN NACL 20-0.9 MG/50ML-% IV SOLN
20.0000 mg | Freq: Once | INTRAVENOUS | Status: AC
  Administered 2017-11-23: 20 mg via INTRAVENOUS
  Filled 2017-11-23: qty 50

## 2017-11-23 MED ORDER — FAMOTIDINE IN NACL 20-0.9 MG/50ML-% IV SOLN
20.0000 mg | Freq: Two times a day (BID) | INTRAVENOUS | Status: DC
Start: 2017-11-23 — End: 2017-11-25
  Administered 2017-11-24 – 2017-11-25 (×3): 20 mg via INTRAVENOUS
  Filled 2017-11-23 (×3): qty 50

## 2017-11-23 MED ORDER — ONDANSETRON 4 MG PO TBDP
4.0000 mg | ORAL_TABLET | Freq: Once | ORAL | Status: AC | PRN
  Administered 2017-11-23: 4 mg via ORAL

## 2017-11-23 NOTE — H&P (Signed)
History and Physical    HOLLEE FATE ZOX:[id number] DOB: 1985/09/21 DOA: 11/23/2017  PCP: Vertis Kelch, NP  Patient coming from: Home  Chief Complaint: Nausea vomiting weakness  HPI: Alison Johnson is a 32 y.o. female with medical history significant of chronic alcohol usage comes in with a month of nausea vomiting at least 5-6 times a day.  Patient has been doing a lot of binge drink drinking recently secondary to her father passing away.  She reports she is having a lot of anxiety and nausea.  She is having a lot of epigastric pain.  She denies any blood in her vomit.  She denies any diarrhea.  She reports that she is unable to eat well because of her poor dentition.  She reports her last alcoholic beverage was over 4 days ago.  Patient found to have a potassium level less than 2 with a metabolic acidosis and referred for admission for repletion of her potassium.  Review of Systems: As per HPI otherwise 10 point review of systems negative.   Past Medical History:  Diagnosis Date  . Cataract     Past Surgical History:  Procedure Laterality Date  . EYE SURGERY       reports that she has been smoking cigarettes.  She has been smoking about 1.00 pack per day. She has never used smokeless tobacco. She reports that she drinks alcohol. She reports that she has current or past drug history. Drug: Marijuana.  Allergies  Allergen Reactions  . Codeine Itching    History reviewed. No pertinent family history.  No premature coronary artery disease  Prior to Admission medications   Medication Sig Start Date End Date Taking? Authorizing Provider  medroxyPROGESTERone (DEPO-PROVERA) 150 MG/ML injection Inject 150 mg into the muscle every 3 (three) months.   Yes [provider]    Physical Exam: Vitals:   11/23/17 1328 11/23/17 1329 11/23/17 1535  BP:  109/75 (!) 93/59  Pulse:  (!) 116 97  Resp:  16 16  Temp:  98.6 F (37 C)   TempSrc:  Oral   SpO2:  100% 100%  Weight: 40.8  kg (90 lb)    Height:  (1.575 m)        Constitutional: NAD, calm, comfortable Vitals:   11/23/17 1328 11/23/17 1329 11/23/17 1535  BP:  109/75 (!) 93/59  Pulse:  (!) 116 97  Resp:  16 16  Temp:  98.6 F (37 C)   TempSrc:  Oral   SpO2:  100% 100%  Weight: 40.8 kg (90 lb)    Height:  (1.575 m)     Eyes: PERRL, lids and conjunctivae normal ENMT: Mucous membranes are dry. Posterior pharynx clear of any exudate or lesions. poor dentition.  Neck: normal, supple, no masses, no thyromegaly Respiratory: clear to auscultation bilaterally, no wheezing, no crackles. Normal respiratory effort. No accessory muscle use.  Cardiovascular: Regular rate and rhythm, no murmurs / rubs / gallops. No extremity edema. 2+ pedal pulses. No carotid bruits.  Abdomen: no tenderness, no masses palpated. No hepatosplenomegaly. Bowel sounds positive.  Musculoskeletal: no clubbing / cyanosis. No joint deformity upper and lower extremities. Good ROM, no contractures. Normal muscle tone.  Skin: no rashes, lesions, ulcers. No induration Neurologic: CN 2-12 grossly intact. Sensation intact, DTR normal. Strength 5/5 in all 4.  Psychiatric: Normal judgment and insight. Alert and oriented x 3. Normal mood.    Labs on Admission: I have personally reviewed following labs and imaging studies  CBC: Recent Labs  Lab 11/23/17 1349  WBC 12.8*  NEUTROABS 10.5*  HGB 13.4  HCT 36.1  MCV 111.8*  PLT 346   Basic Metabolic Panel: Recent Labs  Lab 11/23/17 1349 11/23/17 1517  NA 128*  --   K <2.0*  --   CL 72*  --   CO2 32  --   GLUCOSE 94  --   BUN 15  --   CREATININE 0.73  --   CALCIUM 9.7  --   MG  --  2.1   GFR: Estimated Creatinine Clearance: 64.4 mL/min (by C-G formula based on SCr of 0.73 mg/dL). Liver Function Tests: Recent Labs  Lab 11/23/17 1349  AST 69*  ALT 39  ALKPHOS 111  BILITOT 5.5*  PROT 7.6  ALBUMIN 4.1   Recent Labs  Lab 11/23/17 1349  LIPASE 35   No results for  input(s): AMMONIA in the last 168 hours. Coagulation Profile: No results for input(s): INR, PROTIME in the last 168 hours. Cardiac Enzymes: No results for input(s): CKTOTAL, CKMB, CKMBINDEX, TROPONINI in the last 168 hours. BNP (last 3 results) No results for input(s): PROBNP in the last 8760 hours. HbA1C: No results for input(s): HGBA1C in the last 72 hours. CBG: No results for input(s): GLUCAP in the last 168 hours. Lipid Profile: No results for input(s): CHOL, HDL, LDLCALC, TRIG, CHOLHDL, LDLDIRECT in the last 72 hours. Thyroid Function Tests: No results for input(s): TSH, T4TOTAL, FREET4, T3FREE, THYROIDAB in the last 72 hours. Anemia Panel: No results for input(s): VITAMINB12, FOLATE, FERRITIN, TIBC, IRON, RETICCTPCT in the last 72 hours. Urine analysis:    Component Value Date/Time   COLORURINE AMBER (A) 11/23/2017 1331   APPEARANCEUR HAZY (A) 11/23/2017 1331   LABSPEC 1.029 11/23/2017 1331   PHURINE 5.0 11/23/2017 1331   GLUCOSEU NEGATIVE 11/23/2017 1331   HGBUR SMALL (A) 11/23/2017 1331   BILIRUBINUR MODERATE (A) 11/23/2017 1331   KETONESUR 20 (A) 11/23/2017 1331   PROTEINUR 30 (A) 11/23/2017 1331   NITRITE NEGATIVE 11/23/2017 1331   LEUKOCYTESUR SMALL (A) 11/23/2017 1331   Sepsis Labs: !!!!!!!!!!!!!!!!!!!!!!!!!!!!!!!!!!!!!!!!!!!! (procalcitonin:4,lacticidven:4) )No results found for this or any previous visit (from the past 240 hour(s)).   Radiological Exams on Admission: No results found.  EKG: Independently reviewed.  Normal sinus rhythm no acute changes  Old chart reviewed  Case discussed with EDP  Assessment/Plan 32 year old female with alcohol usage comes in with likely gastritis and nausea vomiting for a month found to be hypokalemic  Principal Problem:   Hypokalemia-placed on telemetry monitoring overnight.  Patient given 30 mEq of KCl IV now and 60 mg orally now.  Will place on KCl 40 mEq twice a day and repeat a BMP later today and replete  more IV tonight if needed.  And also repeat BMP in the morning.  Magnesium level is normal and she is received 2 g of mag sulfate IV in the emergency department already.  This is all due to GI volume losses.  Active Problems:   Alcohol use-last use was over 4 days ago we will provide her a banana bag    Gastritis abdominal exam is benign.  Will place her on Pepcid Carafate-GI cocktail and see if we get her nausea and vomiting and under control with this along with as needed Zofran    Nausea and vomiting-as above    Macrocytosis without anemia-due to chronic alcohol usage    Metabolic acidosis due to GI volume losses as above-IV fluids   Check  urine drug screen Tylenol level and salicylate level  DVT prophylaxis: SCDs Code Status: Full Family Communication: Boyfriend Disposition Plan: Per day team Consults called: None Admission status: Observation   Norman Bier A MD [facility]  If 7PM-7AM, please contact night-coverage [email or web address] Password [facility]  11/23/2017, 5:28 PM

## 2017-11-23 NOTE — ED Provider Notes (Signed)
[facility] EMERGENCY DEPARTMENT Provider Note   CSN: [id number] Arrival date & time: 11/23/17  1322     History   Chief Complaint Chief Complaint  Patient presents with  . Emesis    HPI Alison Johnson is a 32 y.o. female.  HPI   32 year old female with generalized weakness and nausea/vomiting.  Symptoms worsening over the past few weeks.  Associated with some epigastric discomfort.  She has a long standing history of alcohol use, but reports acutely increasing the amount over the past 2 weeks after her father recently dying.  She reports that her diet has been poor.  Past Medical History:  Diagnosis Date  . Cataract     Patient Active Problem List   Diagnosis Date Noted  . Closed fracture of proximal end of right humerus with routine healing 09/04/17 09/22/2017    Past Surgical History:  Procedure Laterality Date  . EYE SURGERY       OB History    Gravida  2   Para      Term      Preterm      AB      Living  2     SAB      TAB      Ectopic      Multiple      Live Births               Home Medications    Prior to Admission medications   Medication Sig Start Date End Date Taking? Authorizing Provider  acetaminophen (TYLENOL) 500 MG tablet Take 500 mg by mouth every 6 (six) hours as needed. For pain     [provider]  HYDROcodone-acetaminophen (NORCO/VICODIN) 5-325 MG tablet One tablet every four hours as needed for acute pain.  Limit of five days per [place]. 09/27/17   Vickki Hearing, MD  ibuprofen (ADVIL,MOTRIN) 400 MG tablet Take 1.5 tablets (600 mg total) by mouth every 6 (six) hours as needed. Take with food 09/04/17   Triplett, Tammy, PA-C  venlafaxine (EFFEXOR-XR) 75 MG 24 hr capsule Take 75 mg by mouth daily.      [provider]    Family History History reviewed. No pertinent family history.  Social History Social History   Tobacco Use  . Smoking status: Current Every Day Smoker    Packs/day:  1.00    Types: Cigarettes  . Smokeless tobacco: Never Used  Substance Use Topics  . Alcohol use: Yes    Comment: daily 3-40oz daily  . Drug use: Yes    Types: Marijuana     Allergies   Codeine   Review of Systems Review of Systems  All systems reviewed and negative, other than as noted in HPI.  Physical Exam Updated Vital Signs BP (!) 93/59 (BP Location: Right Arm)   Pulse 97   Temp 98.6 F (37 C) (Oral)   Resp 16   Ht  (1.575 m)   Wt 40.8 kg (90 lb)   SpO2 100%   BMI 16.46 kg/m   Physical Exam  Constitutional: She appears well-developed and well-nourished. No distress.  HENT:  Head: Normocephalic and atraumatic.  Eyes: Conjunctivae are normal. Right eye exhibits no discharge. Left eye exhibits no discharge.  Neck: Neck supple.  Cardiovascular: Normal rate, regular rhythm and normal heart sounds. Exam reveals no gallop and no friction rub.  No murmur heard. Pulmonary/Chest: Effort normal and breath sounds normal. No respiratory distress.  Abdominal: Soft.  She exhibits no distension. There is tenderness.  Mild epigastric tenderness without rebound or guarding.  No distention.  No surgical scars noted.  Musculoskeletal: She exhibits no edema or tenderness.  Neurological: She is alert.  Skin: Skin is warm and dry.  Psychiatric: She has a normal mood and affect. Her behavior is normal. Thought content normal.  Nursing note and vitals reviewed.    ED Treatments / Results  Labs (all labs ordered are listed, but only abnormal results are displayed) Labs Reviewed  COMPREHENSIVE METABOLIC PANEL - Abnormal; Notable for the following components:      Result Value   Sodium 128 (*)    Potassium <2.0 (*)    Chloride 72 (*)    AST 69 (*)    Total Bilirubin 5.5 (*)    Anion gap >20 (*)    All other components within normal limits  URINALYSIS, ROUTINE W REFLEX MICROSCOPIC - Abnormal; Notable for the following components:   Color, Urine AMBER (*)    APPearance  HAZY (*)    Hgb urine dipstick SMALL (*)    Bilirubin Urine MODERATE (*)    Ketones, ur 20 (*)    Protein, ur 30 (*)    Leukocytes, UA SMALL (*)    Bacteria, UA RARE (*)    All other components within normal limits  CBC WITH DIFFERENTIAL/PLATELET - Abnormal; Notable for the following components:   WBC 12.8 (*)    RBC 3.23 (*)    MCV 111.8 (*)    MCH 41.5 (*)    MCHC 37.1 (*)    Neutro Abs 10.5 (*)    All other components within normal limits  RAPID URINE DRUG SCREEN, HOSP PERFORMED - Abnormal; Notable for the following components:   Tetrahydrocannabinol POSITIVE (*)    All other components within normal limits  ACETAMINOPHEN LEVEL - Abnormal; Notable for the following components:   Acetaminophen (Tylenol), Serum <10 (*)    All other components within normal limits  BASIC METABOLIC PANEL - Abnormal; Notable for the following components:   Sodium 126 (*)    Potassium 2.3 (*)    Chloride 86 (*)    Calcium 7.1 (*)    All other components within normal limits  BASIC METABOLIC PANEL - Abnormal; Notable for the following components:   Sodium 131 (*)    Potassium 2.2 (*)    Chloride 95 (*)    Calcium 7.4 (*)    All other components within normal limits  CBC - Abnormal; Notable for the following components:   RBC 2.27 (*)    Hemoglobin 9.4 (*)    HCT 26.0 (*)    MCV 114.5 (*)    MCH 41.4 (*)    MCHC 36.2 (*)    All other components within normal limits  MAGNESIUM - Abnormal; Notable for the following components:   Magnesium 2.6 (*)    All other components within normal limits  LIPASE, BLOOD  PREGNANCY, URINE  MAGNESIUM  LACTIC ACID, PLASMA  LACTIC ACID, PLASMA  SALICYLATE LEVEL  HIV ANTIBODY (ROUTINE TESTING)  PHOSPHORUS  BASIC METABOLIC PANEL  VITAMIN B12    EKG EKG Interpretation  Date/Time:  Wednesday Nov 23 2017 17:14:18 EDT Ventricular Rate:  87 PR Interval:    QRS Duration: 73 QT Interval:  371 QTC Calculation: 447 R Axis:   78 Text Interpretation:   Sinus rhythm Nonspecific T abnormalities, diffuse leads No old tracing to compare Confirmed by Rolan Bucco [phone]) on 11/24/2017 9:08:07 AM  Radiology No results found.  Procedures Procedures (including critical care time)  Medications Ordered in ED Medications  ondansetron (ZOFRAN-ODT) 4 MG disintegrating tablet (has no administration in time range)  potassium chloride SA (K-DUR,KLOR-CON) CR tablet 60 mEq (has no administration in time range)  sodium chloride 0.9 % bolus 1,000 mL (has no administration in time range)  magnesium sulfate IVPB 2 g 50 mL (has no administration in time range)  potassium chloride 10 mEq in 100 mL IVPB (has no administration in time range)  lactated ringers bolus 1,000 mL (has no administration in time range)  famotidine (PEPCID) IVPB 20 mg premix (has no administration in time range)  ondansetron (ZOFRAN) injection 4 mg (has no administration in time range)  ondansetron (ZOFRAN-ODT) disintegrating tablet 4 mg (4 mg Oral Given 11/23/17 1333)     Initial Impression / Assessment and Plan / ED Course  I have reviewed the triage vital signs and the nursing notes.  Pertinent labs & imaging results that were available during my care of the patient were reviewed by me and considered in my medical decision making (see chart for details).     32 year old female with epigastric discomfort and intermittent nausea vomiting for about 2 weeks.  She reports significant alcohol use.  I suspect this is alcoholic gastritis.  Lipase is normal.  Only mild tenderness in the epigastrium on exam.  She is markedly hypokalemic with a potassium of less than 2.  Will begin supplementation and admit for ongoing tx.   Final Clinical Impressions(s) / ED Diagnoses   Final diagnoses:  Hypokalemia  Alcohol abuse    ED Discharge Orders    None       Raeford Razor, MD 11/24/17 2328

## 2017-11-23 NOTE — ED Notes (Signed)
CRITICAL VALUE ALERT  Critical Value:  Potassium 2.3  Date & Time Notied:  11/23/2017 2046  Provider Notified: Monia Pouch, RN will notify Dr. Onalee Hua  Orders Received/Actions taken: verbal given to 3rd floor rn

## 2017-11-23 NOTE — ED Triage Notes (Signed)
Pt c/o n/v x 9 days. Denies diarrhea. Mm dry. A/o. C/o gen weakness

## 2017-11-24 ENCOUNTER — Other Ambulatory Visit: Payer: Self-pay

## 2017-11-24 DIAGNOSIS — E43 Unspecified severe protein-calorie malnutrition: Secondary | ICD-10-CM

## 2017-11-24 LAB — BASIC METABOLIC PANEL
ANION GAP: 9 (ref 5–15)
BUN: 7 mg/dL (ref 6–20)
CHLORIDE: 95 mmol/L — AB (ref 101–111)
CO2: 27 mmol/L (ref 22–32)
CREATININE: 0.48 mg/dL (ref 0.44–1.00)
Calcium: 7.4 mg/dL — ABNORMAL LOW (ref 8.9–10.3)
GFR calc non Af Amer: >60 mL/min (ref >60)
Glucose, Bld: 92 mg/dL (ref 65–99)
Potassium: 2.2 mmol/L — CL (ref 3.5–5.1)
SODIUM: 131 mmol/L — AB (ref 135–145)

## 2017-11-24 LAB — CBC
HCT: 26 % — ABNORMAL LOW (ref 36.0–46.0)
HEMOGLOBIN: 9.4 g/dL — AB (ref 12.0–15.0)
MCH: 41.4 pg — ABNORMAL HIGH (ref 26.0–34.0)
MCHC: 36.2 g/dL — ABNORMAL HIGH (ref 30.0–36.0)
MCV: 114.5 fL — AB (ref 78.0–100.0)
PLATELETS: 228 10*3/uL (ref 150–400)
RBC: 2.27 MIL/uL — AB (ref 3.87–5.11)
RDW: 15.5 % (ref 11.5–15.5)
WBC: 5.3 10*3/uL (ref 4.0–10.5)

## 2017-11-24 LAB — MAGNESIUM: Magnesium: 2.6 mg/dL — ABNORMAL HIGH (ref 1.7–2.4)

## 2017-11-24 LAB — HIV ANTIBODY (ROUTINE TESTING W REFLEX): HIV Screen 4th Generation wRfx: NONREACTIVE

## 2017-11-24 LAB — PHOSPHORUS: Phosphorus: 2.6 mg/dL (ref 2.5–4.6)

## 2017-11-24 MED ORDER — ALPRAZOLAM 0.5 MG PO TABS
0.5000 mg | ORAL_TABLET | Freq: Once | ORAL | Status: AC
  Administered 2017-11-24: 0.5 mg via ORAL
  Filled 2017-11-24: qty 1

## 2017-11-24 MED ORDER — FOLIC ACID 1 MG PO TABS
1.0000 mg | ORAL_TABLET | Freq: Every day | ORAL | Status: DC
  Administered 2017-11-24 – 2017-11-25 (×2): 1 mg via ORAL
  Filled 2017-11-24 (×2): qty 1

## 2017-11-24 MED ORDER — POTASSIUM CHLORIDE CRYS ER 20 MEQ PO TBCR
40.0000 meq | EXTENDED_RELEASE_TABLET | ORAL | Status: AC
  Administered 2017-11-24 (×3): 40 meq via ORAL
  Filled 2017-11-24 (×3): qty 2

## 2017-11-24 MED ORDER — LORAZEPAM 2 MG/ML IJ SOLN: 1.0000 mg | Freq: Four times a day (QID) | INTRAMUSCULAR | Status: DC | PRN

## 2017-11-24 MED ORDER — LORAZEPAM 1 MG PO TABS
1.0000 mg | ORAL_TABLET | Freq: Four times a day (QID) | ORAL | Status: DC | PRN
  Administered 2017-11-24 (×2): 1 mg via ORAL
  Filled 2017-11-24 (×2): qty 1

## 2017-11-24 MED ORDER — NICOTINE 21 MG/24HR TD PT24
21.0000 mg | MEDICATED_PATCH | Freq: Every day | TRANSDERMAL | Status: DC
  Administered 2017-11-24 – 2017-11-25 (×2): 21 mg via TRANSDERMAL
  Filled 2017-11-24 (×2): qty 1

## 2017-11-24 MED ORDER — POTASSIUM CHLORIDE 10 MEQ/100ML IV SOLN
10.0000 meq | INTRAVENOUS | Status: AC
  Administered 2017-11-24 (×4): 10 meq via INTRAVENOUS
  Filled 2017-11-24: qty 100

## 2017-11-24 MED ORDER — ADULT MULTIVITAMIN W/MINERALS CH
1.0000 | ORAL_TABLET | Freq: Every day | ORAL | Status: DC
  Administered 2017-11-24 – 2017-11-25 (×2): 1 via ORAL
  Filled 2017-11-24 (×2): qty 1

## 2017-11-24 MED ORDER — SODIUM CHLORIDE 0.9 % IV SOLN
INTRAVENOUS | Status: DC
  Administered 2017-11-24 – 2017-11-25 (×2): via INTRAVENOUS

## 2017-11-24 MED ORDER — ENSURE ENLIVE PO LIQD
237.0000 mL | Freq: Two times a day (BID) | ORAL | Status: DC
  Administered 2017-11-25: 237 mL via ORAL

## 2017-11-24 MED ORDER — VITAMIN B-1 100 MG PO TABS
100.0000 mg | ORAL_TABLET | Freq: Every day | ORAL | Status: DC
  Administered 2017-11-24 – 2017-11-25 (×2): 100 mg via ORAL
  Filled 2017-11-24 (×2): qty 1

## 2017-11-24 MED ORDER — CHLORDIAZEPOXIDE HCL 5 MG PO CAPS
10.0000 mg | ORAL_CAPSULE | Freq: Three times a day (TID) | ORAL | Status: DC
  Administered 2017-11-24 – 2017-11-25 (×3): 10 mg via ORAL
  Filled 2017-11-24 (×3): qty 2

## 2017-11-24 MED ORDER — PANTOPRAZOLE SODIUM 40 MG PO TBEC
40.0000 mg | DELAYED_RELEASE_TABLET | Freq: Every day | ORAL | Status: DC
  Administered 2017-11-24 – 2017-11-25 (×2): 40 mg via ORAL
  Filled 2017-11-24 (×2): qty 1

## 2017-11-24 NOTE — Progress Notes (Signed)
Initial Nutrition Assessment  DOCUMENTATION CODES:   Severe malnutrition in context of social or environmental circumstances, Underweight  INTERVENTION:   - Ensure Enlive po BID, each supplement provides 350 kcal and 20 grams of protein (please provide CHOCOLATE flavor)  - Magic cup TID with meals, each supplement provides 290 kcal and 9 grams of protein  NUTRITION DIAGNOSIS:   Severe Malnutrition related to social / environmental circumstances, other (see comment), vomiting, nausea(EtOH abuse, poor dentition) as evidenced by percent weight loss, mild fat depletion, moderate fat depletion, severe fat depletion, mild muscle depletion, moderate muscle depletion, severe muscle depletion(8.2% weight loss in 2 months).  GOAL:   Patient will meet greater than or equal to 90% of their needs  MONITOR:   PO intake, Supplement acceptance, Labs, Weight trends  REASON FOR ASSESSMENT:   Malnutrition Screening Tool    ASSESSMENT:   32 year old female who presented to the ED with nausea/vomiting x 9 days. PMH significant for chronic alcohol usage and tobacco usage. Pt admitted for evaluation and treatment of hypokalemia.  Spoke with pt and her boyfriend at bedside. Pt reports having lost a "significant amount of weight" (10-11 lbs) over the past 6 months. Pt reports her UBW as 110 lbs and that she last weighed this over 6 months ago. Pt also reports that she weighed 101 lbs at a doctors appointment in March. Weights listed in chart appear to be stated weights instead of measured weights. Using stated weights, pt has lost 8 lbs in 2 months. This is an 8.2% weight loss which is significant for timeframe.  Pt states that wile she was sick and experiencing nausea and vomiting, she was unable to consume any solid foods. Pt was able to tolerate Propel, Gatorade, and Sprite during this time. Pt reports that her appetite is "coming back" and that she ate "most my lunch" which included [place], gravy,  sweet potatoes, and greens.  Pt reports that she believes she lost weight due to illness and having "bad teeth." Pt denies that any of her recent weight loss has been intentional stating that "I'm small enough as it is." Pt reports difficulty chewing but no difficulty swallowing.  Pt agreeable to drinking Ensure Enlive oral nutrition supplements (prefers chocolate flavor) and receiving Magic Cup with meal trays. Discussed importance of adequate PO intake and protein intake as pt has both subcutaneous fat and muscle wasting.  Medications reviewed and include: 1 mg folic acid daily, MVI with minerals daily, 40 mg Protonix daily, 40 mEq potassium chloride x 4 runs today, 100 mg thiamine daily, 20 mg IV Pepcid BID, PRN GI cocktail  Labs reviewed: sodium 131 (L), potassium 2.2 (L), chloride 95 (L), calcium 7.4 (L), magnesium 2.6 (H), hemoglobin 9.4 (L), HCT 26.0 (L)  NUTRITION - FOCUSED PHYSICAL EXAM:    Most Recent Value  Orbital Region  Severe depletion  Upper Arm Region  Moderate depletion  Thoracic and Lumbar Region  Severe depletion  Buccal Region  Mild depletion  Temple Region  Moderate depletion  Clavicle Bone Region  Severe depletion  Clavicle and Acromion Bone Region  Severe depletion  Scapular Bone Region  Severe depletion  Dorsal Hand  Mild depletion  Patellar Region  Moderate depletion  Anterior Thigh Region  Moderate depletion  Posterior Calf Region  Severe depletion  Edema (RD Assessment)  None  Hair  Reviewed  Eyes  Reviewed  Mouth  Reviewed [poor dentition]  Skin  Reviewed  Nails  Reviewed       Diet  Order:   Diet Order           Diet Heart Room service appropriate? Yes; Fluid consistency: Thin  Diet effective now          EDUCATION NEEDS:   Education needs have been addressed  Skin:  Skin Assessment: Reviewed RN Assessment  Last BM:  11/24/17  Height:   Ht Readings from Last 1 Encounters:  11/23/17  (1.575 m)    Weight:   Wt Readings from Last  1 Encounters:  11/23/17 90 lb (40.8 kg)    Ideal Body Weight:  50 kg  BMI:  Body mass index is 16.46 kg/m.  Estimated Nutritional Needs:   Kcal:  1400-1600 kcal/day (35-39 kcal/kg)  Protein:  60-75 grams/day  Fluid:  1.4-1.6 L/day    Earma Reading, MS, RD, LDN Pager: [phone] Weekend/After Hours: [phone]

## 2017-11-24 NOTE — Progress Notes (Signed)
Notified Dr. Onalee Hua of patient potassium from 1914.  Patient had not finished potassium runs upon arrival to floor.  No call back and no new orders received.

## 2017-11-24 NOTE — Progress Notes (Signed)
CRITICAL VALUE ALERT  Critical Value: potassium 2.2  Date & Time Notied:  0614  Provider Notified:lama, md  Orders Received/Actions taken:order given for runs of potassium

## 2017-11-24 NOTE — Progress Notes (Signed)
[facility] PROGRESS NOTE  Alison Johnson ZOX:[id number] DOB: 1986-05-08 DOA: 11/23/2017 PCP: Vertis Kelch, NP  Interim summary and HPI 32 y.o. female with medical history significant of chronic alcohol usage comes in with a month of nausea vomiting at least 5-6 times a day.  Patient has been doing a lot of binge drink drinking recently secondary to her father passing away.  She reports she is having a lot of anxiety and nausea.  She is having a lot of epigastric pain.  She denies any blood in her vomit.  She denies any diarrhea.  She reports that she is unable to eat well because of her poor dentition.  She reports her last alcoholic beverage was over 4 days ago  Assessment/Plan: 1-nausea, vomiting epigastric pain: In the setting of chronic alcohol abuse with intensified binge drinking in the last 4-6 weeks. -will monitor on CIWA -continue thiamine and folic acid -will continue PPI, carafate and PRN GI cocktail -alcohol cessation counseling provided  -continue PRN antiemetics and follow clinical response   2-alcohol abuse -as mentioned above will use CIWA protocol -start also  TID librium, which I'm planning to discharge home on tapering fashion -cessation counsleing provided  3-tobacco abuse -cessation counseling provided -continue nicotine patch  -over 3 minutes spent discussing increased risk for cancers, COPD, stroke heart attack by continued use of smoking.  Different options to assist with quitting process ([employer] 1-800 quit now hotline, the use of bupropion, Chantix, and nicotine products were reviewed with patient).  4-severe protein calorie malnutrition  -Body mass index is 16.46 kg/m. -patient has lost over 8 pounds in the last 2 months -started on nutritional supplementations as per clinical dietitian recommended   5-hypokalemia -due to poor oral intake, GI loses and use of alcohol -will continue repletion -continue monitoring on telemetry  -K 2.2 -Mg and  Phosphorus WNL  6-macrocytosis -most likely associated with alcohol use -will check B12  Code Status: Full Code  Family Communication: boyfriend at bedside  Disposition Plan: Continue IV fluids, continue present, continue electrolytes repletion.  Hoping for future stabilization on her electrolytes for discharge on 11/25/2017.   Consultants:  None  Procedures:  See below for x-ray report.  Antibiotics:  None   HPI/Subjective: Patient reports feeling much better, no nausea, no vomiting and expressed that she is eating and drinking easier.. Still with some vague mid epigastric pain discomfort   Objective: Vitals:   11/24/17 0417 11/24/17 1453  BP: 95/62 (!) 96/59  Pulse: 78 (!) 105  Resp:  18  Temp: 98.5 F (36.9 C)   SpO2: 100% 100%    Intake/Output Summary (Last 24 hours) at 11/24/2017 1821 Last data filed at 11/24/2017 1600 Gross per 24 hour  Intake 611.25 ml  Output 3 ml  Net 608.25 ml   Filed Weights   11/23/17 1328  Weight: 40.8 kg (90 lb)    Exam:   General: Feeling better, alert, awake and oriented to, reports no withdrawal symptoms.  Eating more and starting to hydrate herself better. Reports no nausea vomiting.  Patient appears underweight on exam.  Cardiovascular: S1 and S2, no rubs, no gallops, no JVD.  Respiratory: Good air movement bilaterally, no wheezing, no crackles.  Abdomen: Soft, nondistended, positive bowel sounds, no guarding.  Musculoskeletal: No edema, no cyanosis, no clubbing.  Data Reviewed: Basic Metabolic Panel: Recent Labs  Lab 11/23/17 1349 11/23/17 1517 11/23/17 1914 11/24/17 0514  NA 128*  --  126* 131*  K <2.0*  --  2.3*  2.2*  CL 72*  --  86* 95*  CO2 32  --  26 27  GLUCOSE 94  --  99 92  BUN 15  --  11 7  CREATININE 0.73  --  0.58 0.48  CALCIUM 9.7  --  7.1* 7.4*  MG  --  2.1  --  2.6*  PHOS  --   --   --  2.6   Liver Function Tests: Recent Labs  Lab 11/23/17 1349  AST 69*  ALT 39  ALKPHOS 111  BILITOT  5.5*  PROT 7.6  ALBUMIN 4.1   Recent Labs  Lab 11/23/17 1349  LIPASE 35   CBC: Recent Labs  Lab 11/23/17 1349 11/24/17 0514  WBC 12.8* 5.3  NEUTROABS 10.5*  --   HGB 13.4 9.4*  HCT 36.1 26.0*  MCV 111.8* 114.5*  PLT 346 228    Studies: No results found.  Scheduled Meds: . feeding supplement (ENSURE ENLIVE)  237 mL Oral BID BM  . folic acid  1 mg Oral Daily  . multivitamin with minerals  1 tablet Oral Daily  . nicotine  21 mg Transdermal Daily  . pantoprazole  40 mg Oral Daily  . potassium chloride  40 mEq Oral Q4H  . sucralfate  1 g Oral TID WC & HS  . thiamine  100 mg Oral Daily   Continuous Infusions: . sodium chloride 75 mL/hr at 11/24/17 1351  . famotidine (PEPCID) IV Stopped (11/24/17 1255)      Time spent: 25 minutes    Vassie Loll  [facility] Pager [phone]. If 7PM-7AM, please contact night-coverage at [email or web address], password [facility] 11/24/2017, 6:21 PM  LOS: 0 days

## 2017-11-25 DIAGNOSIS — E43 Unspecified severe protein-calorie malnutrition: Secondary | ICD-10-CM

## 2017-11-25 DIAGNOSIS — E876 Hypokalemia: Secondary | ICD-10-CM

## 2017-11-25 DIAGNOSIS — R112 Nausea with vomiting, unspecified: Secondary | ICD-10-CM

## 2017-11-25 DIAGNOSIS — F1010 Alcohol abuse, uncomplicated: Secondary | ICD-10-CM

## 2017-11-25 DIAGNOSIS — K2920 Alcoholic gastritis without bleeding: Secondary | ICD-10-CM

## 2017-11-25 DIAGNOSIS — D7589 Other specified diseases of blood and blood-forming organs: Secondary | ICD-10-CM

## 2017-11-25 LAB — BASIC METABOLIC PANEL
ANION GAP: 6 (ref 5–15)
BUN: <5 mg/dL — ABNORMAL LOW (ref 6–20)
CO2: 22 mmol/L (ref 22–32)
Calcium: 7.9 mg/dL — ABNORMAL LOW (ref 8.9–10.3)
Chloride: 107 mmol/L (ref 101–111)
Creatinine, Ser: 0.48 mg/dL (ref 0.44–1.00)
GFR calc non Af Amer: >60 mL/min (ref >60)
Glucose, Bld: 89 mg/dL (ref 65–99)
POTASSIUM: 4 mmol/L (ref 3.5–5.1)
Sodium: 135 mmol/L (ref 135–145)

## 2017-11-25 LAB — VITAMIN B12: Vitamin B-12: 250 pg/mL (ref 180–914)

## 2017-11-25 MED ORDER — ADULT MULTIVITAMIN W/MINERALS CH: 1.0000 | ORAL_TABLET | Freq: Every day | ORAL | 1 refills | Status: DC

## 2017-11-25 MED ORDER — ENSURE ENLIVE PO LIQD: 237.0000 mL | Freq: Two times a day (BID) | ORAL | Status: DC

## 2017-11-25 MED ORDER — NICOTINE 21 MG/24HR TD PT24: 21.0000 mg | MEDICATED_PATCH | Freq: Every day | TRANSDERMAL | 1 refills | Status: DC

## 2017-11-25 MED ORDER — PANTOPRAZOLE SODIUM 40 MG PO TBEC: 40.0000 mg | DELAYED_RELEASE_TABLET | Freq: Every day | ORAL | 1 refills | Status: AC

## 2017-11-25 MED ORDER — CHLORDIAZEPOXIDE HCL 10 MG PO CAPS: ORAL_CAPSULE | ORAL | 0 refills | Status: DC

## 2017-11-25 NOTE — Plan of Care (Signed)
progressing 

## 2017-11-25 NOTE — Discharge Summary (Signed)
Physician Discharge Summary  Alison Johnson:[id number] DOB: Nov 28, 1985 DOA: 11/23/2017  PCP: Vertis Kelch, NP  Admit date: 11/23/2017 Discharge date: 11/25/2017  Time spent: 35 minutes  Recommendations for Outpatient Follow-up:  1. Repeat basic metabolic panel to follow electrolytes and renal function 2. Continue helping and supporting patient during her alcohol and tobacco cessation journey as needed.   Discharge Diagnoses:  Principal Problem:   Hypokalemia Active Problems:   Alcohol use   Gastritis   Nausea and vomiting   Macrocytosis without anemia   Metabolic acidosis   Protein-calorie malnutrition, severe   Alcohol abuse   Discharge Condition: Stable and improved.  Patient discharged home with instruction to follow-up with PCP in 10 days.  Diet recommendation: Regular diet; with instructions to use feeding supplements.  Filed Weights   11/23/17 1328  Weight: 40.8 kg (90 lb)    History of present illness:  As per H&P written by Dr. Onalee Hua on 5/100/19. 32 y.o.femalewith medical history significant oftobacco abuse and chronic alcohol usage; came to ED with a month of nausea/vomiting at least 5-6 times a day. Patient has been doing a lot of binge drink drinking recently secondary to her father passing away. She reports she is having a lot of anxiety and nausea. She is having a lot of epigastric pain. She denies any blood in her vomit. She denies any diarrhea. She reports that she is unable to eat well because of her poor dentition. She reports her last alcoholic beverage was over 4 days ago.  Hospital Course:  1-nausea, vomiting epigastric pain: In the setting of chronic alcohol abuse with intensified binge drinking in the last 4-6 weeks. -continue thiamine and folic acid -will discharge on PPI -alcohol cessation counseling provided  -patient advise to keep herself well hydrated -no nausea or further vomiting at discharge   2-alcohol abuse -as mentioned above  she was monitored under CIWA protocol while inpatient. -discharge on tapering librium -no major withdrawal symptoms present at discharge. -cessation counsleing provided  3-tobacco abuse -cessation counseling provided -continue nicotine patch  -over 3 minutes spent discussing increased risk for cancers, COPD, stroke heart attack by continued use of smoking.  Different options to assist with quitting process ([employer] 1-800 quit now hotline, the use of bupropion, Chantix, and nicotine products were reviewed with patient).  4-severe protein calorie malnutrition  -Body mass index is 16.46 kg/m. -patient has lost over 8 pounds in the last 2 months -started on nutritional supplementations as per clinical dietitian recommendations   5-hypokalemia -due to poor oral intake, GI loses and use of alcohol -Repleted within normal limits at discharge. -Mg and Phosphorus WNL  6-macrocytosis -most likely associated with alcohol use -B12 250 -Multivitamin daily was prescribed.    Procedures:  See below for x-ray reports   Consultations:  None   Discharge Exam: Vitals:   11/24/17 2125 11/25/17 0539  BP: 92/64 94/66  Pulse: 85 93  Resp: 18 18  Temp: 98.9 F (37.2 C) 98.6 F (37 C)  SpO2: 100% 100%     General: Feeling much better, alert, awake and oriented X3; reported no withdrawal symptoms.  Eating more and able to keep herself well hydrated. Reports no nausea or vomiting.  Patient appears underweight on exam.  Cardiovascular: S1 and S2, no rubs, no gallops, no JVD.  Respiratory: Good air movement bilaterally, no wheezing, no crackles.  Abdomen: Soft, nondistended, positive bowel sounds, no guarding.  Musculoskeletal: No edema, no cyanosis, no clubbing.   Discharge Instructions  Discharge Instructions    Discharge instructions   Complete by:  As directed    Take medications as prescribed  Keep yourself well hydrated Stop alcohol use and stop smoking  Follow up  with PCP in 10 days     Allergies as of 11/25/2017      Reactions   Codeine Itching      Medication List    TAKE these medications   chlordiazePOXIDE 10 MG capsule Commonly known as:  LIBRIUM Take 1 tablet TID for 2 days; then 1 tablet twice a day for 2 days; then 1 tablet daily for 3 days and stop librium.   feeding supplement (ENSURE ENLIVE) Liqd Take 237 mLs by mouth 2 (two) times daily between meals.   medroxyPROGESTERone 150 MG/ML injection Commonly known as:  DEPO-PROVERA Inject 150 mg into the muscle every 3 (three) months.   multivitamin with minerals Tabs tablet Take 1 tablet by mouth daily. Start taking on:  11/26/2017   nicotine 21 mg/24hr patch Commonly known as:  NICODERM CQ - dosed in mg/24 hours Place 1 patch (21 mg total) onto the skin daily. Start taking on:  11/26/2017   pantoprazole 40 MG tablet Commonly known as:  PROTONIX Take 1 tablet (40 mg total) by mouth daily. Start taking on:  11/26/2017      Allergies  Allergen Reactions  . Codeine Itching   Follow-up Information    Vertis Kelch, NP. Schedule an appointment as soon as possible for a visit in 10 day(s).   Specialty:  Nurse Practitioner Contact information: [address] [place] [address] [phone]            The results of significant diagnostics from this hospitalization (including imaging, microbiology, ancillary and laboratory) are listed below for reference.    Significant Diagnostic Studies: No results found.  Microbiology: No results found for this or any previous visit (from the past 240 hour(s)).   Labs: Basic Metabolic Panel: Recent Labs  Lab 11/23/17 1349 11/23/17 1517 11/23/17 1914 11/24/17 0514 11/25/17 0542  NA 128*  --  126* 131* 135  K <2.0*  --  2.3* 2.2* 4.0  CL 72*  --  86* 95* 107  CO2 32  --  GLUCOSE 94  --  99 92 89  BUN 15  --  11 7 <5*  CREATININE 0.73  --  0.58 0.48 0.48  CALCIUM 9.7  --  7.1* 7.4* 7.9*  MG  --  2.1  --  2.6*   --   PHOS  --   --   --  2.6  --    Liver Function Tests: Recent Labs  Lab 11/23/17 1349  AST 69*  ALT 39  ALKPHOS 111  BILITOT 5.5*  PROT 7.6  ALBUMIN 4.1   Recent Labs  Lab 11/23/17 1349  LIPASE 35   No results for input(s): AMMONIA in the last 168 hours. CBC: Recent Labs  Lab 11/23/17 1349 11/24/17 0514  WBC 12.8* 5.3  NEUTROABS 10.5*  --   HGB 13.4 9.4*  HCT 36.1 26.0*  MCV 111.8* 114.5*  PLT 346 228   Cardiac Enzymes: No results for input(s): CKTOTAL, CKMB, CKMBINDEX, TROPONINI in the last 168 hours. BNP: BNP (last 3 results) No results for input(s): BNP in the last 8760 hours.  ProBNP (last 3 results) No results for input(s): PROBNP in the last 8760 hours.  CBG: No results for input(s): GLUCAP in the last 168 hours.  Signed:  Vassie Loll MD.  [facility] 11/25/2017, 9:46 AM  .

## 2017-11-25 NOTE — Progress Notes (Signed)
Removed both IVs-clean, dry, intact. Reviewed d/c paperwork with patient. Reviewed new medications. Answered all questions. Walked stable patient and belongings to main entrance where she was going to call a cab.

## 2017-12-21 ENCOUNTER — Encounter: Payer: Self-pay | Admitting: Orthopaedic Surgery

## 2017-12-21 ENCOUNTER — Ambulatory Visit: Payer: Self-pay | Admitting: Orthopaedic Surgery

## 2017-12-21 ENCOUNTER — Encounter: Payer: Self-pay | Admitting: Orthopedic Surgery

## 2018-05-14 ENCOUNTER — Other Ambulatory Visit: Payer: Self-pay

## 2018-05-14 ENCOUNTER — Observation Stay (HOSPITAL_COMMUNITY)
Admission: EM | Admit: 2018-05-14 | Discharge: 2018-05-15 | Disposition: A | Discharge status: Home / Self Care | Payer: Medicaid Other | Attending: Internal Medicine | Admitting: Internal Medicine

## 2018-05-14 ENCOUNTER — Encounter (HOSPITAL_COMMUNITY): Payer: Self-pay | Admitting: Emergency Medicine

## 2018-05-14 DIAGNOSIS — Z7289 Other problems related to lifestyle: Secondary | ICD-10-CM

## 2018-05-14 DIAGNOSIS — F17210 Nicotine dependence, cigarettes, uncomplicated: Secondary | ICD-10-CM | POA: Insufficient documentation

## 2018-05-14 DIAGNOSIS — Z789 Other specified health status: Secondary | ICD-10-CM

## 2018-05-14 DIAGNOSIS — F1099 Alcohol use, unspecified with unspecified alcohol-induced disorder: Secondary | ICD-10-CM | POA: Insufficient documentation

## 2018-05-14 DIAGNOSIS — F1090 Alcohol use, unspecified, uncomplicated: Secondary | ICD-10-CM

## 2018-05-14 DIAGNOSIS — R1084 Generalized abdominal pain: Secondary | ICD-10-CM | POA: Diagnosis present

## 2018-05-14 DIAGNOSIS — Z79899 Other long term (current) drug therapy: Secondary | ICD-10-CM | POA: Diagnosis not present

## 2018-05-14 DIAGNOSIS — R197 Diarrhea, unspecified: Secondary | ICD-10-CM

## 2018-05-14 DIAGNOSIS — R112 Nausea with vomiting, unspecified: Secondary | ICD-10-CM

## 2018-05-14 DIAGNOSIS — E876 Hypokalemia: Principal | ICD-10-CM

## 2018-05-14 DIAGNOSIS — F1010 Alcohol abuse, uncomplicated: Secondary | ICD-10-CM

## 2018-05-14 HISTORY — DX: Gastritis, unspecified, without bleeding: K29.70

## 2018-05-14 LAB — COMPREHENSIVE METABOLIC PANEL
ALK PHOS: 120 U/L (ref 38–126)
ALT: 39 U/L (ref 0–44)
ANION GAP: 17 — AB (ref 5–15)
AST: 75 U/L — ABNORMAL HIGH (ref 15–41)
Albumin: 3.8 g/dL (ref 3.5–5.0)
BUN: 7 mg/dL (ref 6–20)
CALCIUM: 8.8 mg/dL — AB (ref 8.9–10.3)
CO2: 42 mmol/L — ABNORMAL HIGH (ref 22–32)
Chloride: 69 mmol/L — ABNORMAL LOW (ref 98–111)
Creatinine, Ser: 0.64 mg/dL (ref 0.44–1.00)
GFR calc Af Amer: >60 mL/min (ref >60)
GFR calc non Af Amer: >60 mL/min (ref >60)
Glucose, Bld: 107 mg/dL — ABNORMAL HIGH (ref 70–99)
Potassium: <2 mmol/L — CL (ref 3.5–5.1)
Sodium: 128 mmol/L — ABNORMAL LOW (ref 135–145)
TOTAL PROTEIN: 7.8 g/dL (ref 6.5–8.1)
Total Bilirubin: 2.9 mg/dL — ABNORMAL HIGH (ref 0.3–1.2)

## 2018-05-14 LAB — BASIC METABOLIC PANEL
ANION GAP: 12 (ref 5–15)
BUN: 6 mg/dL (ref 6–20)
CHLORIDE: 79 mmol/L — AB (ref 98–111)
CO2: 38 mmol/L — AB (ref 22–32)
Calcium: 7.4 mg/dL — ABNORMAL LOW (ref 8.9–10.3)
Creatinine, Ser: 0.58 mg/dL (ref 0.44–1.00)
GFR calc Af Amer: >60 mL/min (ref >60)
GLUCOSE: 133 mg/dL — AB (ref 70–99)
POTASSIUM: 1.4 mmol/L — AB (ref 3.5–5.1)
Sodium: 129 mmol/L — ABNORMAL LOW (ref 135–145)

## 2018-05-14 LAB — CBC
HCT: 42.3 % (ref 36.0–46.0)
Hemoglobin: 15.9 g/dL — ABNORMAL HIGH (ref 12.0–15.0)
MCH: 38.9 pg — AB (ref 26.0–34.0)
MCHC: 37.6 g/dL — ABNORMAL HIGH (ref 30.0–36.0)
MCV: 103.4 fL — ABNORMAL HIGH (ref 80.0–100.0)
NRBC: 0 % (ref 0.0–0.2)
PLATELETS: 317 10*3/uL (ref 150–400)
RBC: 4.09 MIL/uL (ref 3.87–5.11)
RDW: 13 % (ref 11.5–15.5)
WBC: 12.8 10*3/uL — ABNORMAL HIGH (ref 4.0–10.5)

## 2018-05-14 LAB — URINALYSIS, ROUTINE W REFLEX MICROSCOPIC
Bacteria, UA: NONE SEEN
Bilirubin Urine: NEGATIVE
GLUCOSE, UA: NEGATIVE mg/dL
Hgb urine dipstick: NEGATIVE
KETONES UR: NEGATIVE mg/dL
Nitrite: NEGATIVE
PH: 7 (ref 5.0–8.0)
Protein, ur: NEGATIVE mg/dL
SPECIFIC GRAVITY, URINE: 1.006 (ref 1.005–1.030)

## 2018-05-14 LAB — MAGNESIUM: MAGNESIUM: 1.9 mg/dL (ref 1.7–2.4)

## 2018-05-14 LAB — LIPASE, BLOOD: Lipase: 29 U/L (ref 11–51)

## 2018-05-14 LAB — I-STAT BETA HCG BLOOD, ED (MC, WL, AP ONLY): I-stat hCG, quantitative: <5 m[IU]/mL (ref <5)

## 2018-05-14 MED ORDER — POLYETHYLENE GLYCOL 3350 17 G PO PACK: 17.0000 g | PACK | Freq: Every day | ORAL | Status: DC | PRN

## 2018-05-14 MED ORDER — DIAZEPAM 2 MG PO TABS
2.0000 mg | ORAL_TABLET | Freq: Three times a day (TID) | ORAL | Status: DC
  Administered 2018-05-14 – 2018-05-15 (×2): 2 mg via ORAL
  Filled 2018-05-14 (×2): qty 1

## 2018-05-14 MED ORDER — POTASSIUM CHLORIDE CRYS ER 20 MEQ PO TBCR
40.0000 meq | EXTENDED_RELEASE_TABLET | Freq: Once | ORAL | Status: AC
  Administered 2018-05-14: 40 meq via ORAL
  Filled 2018-05-14: qty 2

## 2018-05-14 MED ORDER — M.V.I. ADULT IV INJ
INJECTION | INTRAVENOUS | Status: AC
  Filled 2018-05-14: qty 10

## 2018-05-14 MED ORDER — THIAMINE HCL 100 MG/ML IJ SOLN
INTRAVENOUS | Status: AC
  Filled 2018-05-14: qty 1000

## 2018-05-14 MED ORDER — HYDROXYZINE HCL 25 MG PO TABS
25.0000 mg | ORAL_TABLET | Freq: Once | ORAL | Status: AC
  Administered 2018-05-14: 25 mg via ORAL
  Filled 2018-05-14: qty 1

## 2018-05-14 MED ORDER — ONDANSETRON HCL 4 MG/2ML IJ SOLN: 4.0000 mg | Freq: Four times a day (QID) | INTRAMUSCULAR | Status: DC | PRN

## 2018-05-14 MED ORDER — TRAZODONE HCL 50 MG PO TABS: 50.0000 mg | ORAL_TABLET | Freq: Every evening | ORAL | Status: DC | PRN

## 2018-05-14 MED ORDER — KCL IN DEXTROSE-NACL 40-5-0.45 MEQ/L-%-% IV SOLN: INTRAVENOUS | Status: DC

## 2018-05-14 MED ORDER — ADULT MULTIVITAMIN W/MINERALS CH
1.0000 | ORAL_TABLET | Freq: Every day | ORAL | Status: DC
  Administered 2018-05-15: 1 via ORAL
  Filled 2018-05-14: qty 1

## 2018-05-14 MED ORDER — LORAZEPAM 2 MG/ML IJ SOLN
1.0000 mg | Freq: Once | INTRAMUSCULAR | Status: AC
  Administered 2018-05-14: 1 mg via INTRAVENOUS
  Filled 2018-05-14: qty 1

## 2018-05-14 MED ORDER — SODIUM CHLORIDE 0.9% FLUSH
3.0000 mL | Freq: Two times a day (BID) | INTRAVENOUS | Status: DC
  Administered 2018-05-15: 3 mL via INTRAVENOUS

## 2018-05-14 MED ORDER — PANTOPRAZOLE SODIUM 40 MG PO TBEC
40.0000 mg | DELAYED_RELEASE_TABLET | Freq: Two times a day (BID) | ORAL | Status: DC
  Administered 2018-05-15: 40 mg via ORAL
  Filled 2018-05-14: qty 1

## 2018-05-14 MED ORDER — THIAMINE HCL 100 MG/ML IJ SOLN
INTRAMUSCULAR | Status: AC
  Filled 2018-05-14: qty 2

## 2018-05-14 MED ORDER — ALBUTEROL SULFATE (2.5 MG/3ML) 0.083% IN NEBU: 2.5000 mg | INHALATION_SOLUTION | RESPIRATORY_TRACT | Status: DC | PRN

## 2018-05-14 MED ORDER — HEPARIN SODIUM (PORCINE) 5000 UNIT/ML IJ SOLN
5000.0000 [IU] | Freq: Three times a day (TID) | INTRAMUSCULAR | Status: DC
  Filled 2018-05-14: qty 1

## 2018-05-14 MED ORDER — POTASSIUM CHLORIDE 10 MEQ/100ML IV SOLN
10.0000 meq | Freq: Once | INTRAVENOUS | Status: AC
  Administered 2018-05-14: 10 meq via INTRAVENOUS
  Filled 2018-05-14: qty 100

## 2018-05-14 MED ORDER — MAGNESIUM SULFATE 2 GM/50ML IV SOLN
2.0000 g | Freq: Once | INTRAVENOUS | Status: AC
  Administered 2018-05-14: 2 g via INTRAVENOUS
  Filled 2018-05-14: qty 50

## 2018-05-14 MED ORDER — VITAMIN B-1 100 MG PO TABS
100.0000 mg | ORAL_TABLET | Freq: Every day | ORAL | Status: DC
  Administered 2018-05-15: 100 mg via ORAL
  Filled 2018-05-14: qty 1

## 2018-05-14 MED ORDER — LORAZEPAM 2 MG/ML IJ SOLN: 1.0000 mg | Freq: Four times a day (QID) | INTRAMUSCULAR | Status: DC | PRN

## 2018-05-14 MED ORDER — FOLIC ACID 1 MG PO TABS
1.0000 mg | ORAL_TABLET | Freq: Every day | ORAL | Status: DC
  Administered 2018-05-15: 1 mg via ORAL
  Filled 2018-05-14: qty 1

## 2018-05-14 MED ORDER — PROMETHAZINE HCL 25 MG/ML IJ SOLN
25.0000 mg | Freq: Once | INTRAMUSCULAR | Status: AC
  Administered 2018-05-14: 25 mg via INTRAVENOUS
  Filled 2018-05-14: qty 1

## 2018-05-14 MED ORDER — ACETAMINOPHEN 325 MG PO TABS: 650.0000 mg | ORAL_TABLET | Freq: Four times a day (QID) | ORAL | Status: DC | PRN

## 2018-05-14 MED ORDER — SODIUM CHLORIDE 0.9% FLUSH: 3.0000 mL | INTRAVENOUS | Status: DC | PRN

## 2018-05-14 MED ORDER — THIAMINE HCL 100 MG/ML IJ SOLN
Freq: Once | INTRAVENOUS | Status: AC
  Administered 2018-05-14: 18:00:00 via INTRAVENOUS
  Filled 2018-05-14: qty 1000

## 2018-05-14 MED ORDER — FOLIC ACID 5 MG/ML IJ SOLN
INTRAMUSCULAR | Status: AC
  Filled 2018-05-14: qty 0.2

## 2018-05-14 MED ORDER — LORAZEPAM 1 MG PO TABS: 1.0000 mg | ORAL_TABLET | Freq: Four times a day (QID) | ORAL | Status: DC | PRN

## 2018-05-14 MED ORDER — ACETAMINOPHEN 650 MG RE SUPP: 650.0000 mg | Freq: Four times a day (QID) | RECTAL | Status: DC | PRN

## 2018-05-14 MED ORDER — KCL IN DEXTROSE-NACL 40-5-0.9 MEQ/L-%-% IV SOLN
INTRAVENOUS | Status: DC
  Administered 2018-05-14 – 2018-05-15 (×2): via INTRAVENOUS

## 2018-05-14 MED ORDER — SODIUM CHLORIDE 0.9 % IV SOLN: 250.0000 mL | INTRAVENOUS | Status: DC | PRN

## 2018-05-14 MED ORDER — THIAMINE HCL 100 MG/ML IJ SOLN: 100.0000 mg | Freq: Every day | INTRAMUSCULAR | Status: DC

## 2018-05-14 MED ORDER — ONDANSETRON HCL 4 MG PO TABS: 4.0000 mg | ORAL_TABLET | Freq: Four times a day (QID) | ORAL | Status: DC | PRN

## 2018-05-14 MED ORDER — NICOTINE 14 MG/24HR TD PT24
14.0000 mg | MEDICATED_PATCH | Freq: Every day | TRANSDERMAL | Status: DC
  Administered 2018-05-14 – 2018-05-15 (×2): 14 mg via TRANSDERMAL
  Filled 2018-05-14 (×2): qty 1

## 2018-05-14 NOTE — H&P (Signed)
Patient Demographics:    Alison Johnson, is a 32 y.o. female  MRN: [id number]   DOB - 07-01-1986  Admit Date - 05/14/2018  Outpatient Primary MD for the patient is Vertis Kelch, NP   Assessment & Plan:    Principal Problem:   Hypokalemia Active Problems:   Alcohol use   Nausea and vomiting    1)FEN/hypokalemia/hyponatremia/hypochloremia-severe electrolyte derangement due to GI losses in the setting of alcohol abuse --- potassium was less than 2, sodium is 128 and chloride 69, hydrate aggressively with saline solution, replace potassium IV and orally, serum mag was 1.9, patient already received IV magnesium in the ED----continue to maintain hydration and replacement of electrolytes until oral intake is improved  2)Alcoholic liver disease and alcoholic gastritis--LFTs are elevated in the pattern consistent with alcoholic liver injury, abstinence from alcohol advised, give Protonix 40 twice daily for alcoholic gastritis, no evidence of acute GI bleed at this time  3)Etoh Abuse--- high risk for delirium tremens, lorazepam as per CIWA protocol, IV banana bag, multivitamin folic acid and thiamine as ordered..... Post discharge patient will need outpatient rehab program  4)Tobacco Abuse-  Smoking cessation counseling for 4 minutes today, give nicotine patch  5)Intractable Emesis and Diarrhea--- lipase is not elevated, suspect that he secondary to alcohol abuse, antiemetics as ordered, leukocytosis noted, suspected reactive leukocytosis, clinically no evidence of acute abdomen at this time,  if patient's symptoms fail to improve as anticipated please consider abdominal imaging.      With History of - Reviewed by me  Past Medical History:  Diagnosis Date  . Cataract   . Gastritis       Past Surgical History:    Procedure Laterality Date  . EYE SURGERY        Chief Complaint  Patient presents with  . Abdominal Pain      HPI:    Alison Johnson  is a 32 y.o. female with past medical history relevant for alcohol and tobacco abuse who presents to the ED with persistent intractable emesis for the last 3 weeks also with intermittent loose stools  No sick contacts at home, no fevers no chills, emesis is without bile or blood, diarrhea is without mucus or blood  She reports epigastric discomfort, poor oral intake, and 6 pound weight loss and fatigue  Additional history obtained from her significant other Mitchel at bedside  No frank chest pains no palpitations, no shortness of breath no pleuritic symptoms no leg pains or leg swelling  In ED... Patient is found to have significant electrolyte derangement with potassium less than 2, chloride of 69, sodium of 128, LFTs are elevated in the pattern consistent with alcoholic liver disease, MCV and MCH are elevated consistent with EtOH abuse  In ED.... EKG noted, patient received IV magnesium and potassium on admission was requested    Review of systems:    In addition to the HPI above,   A full Review of  Systems was done, all other systems reviewed are negative except as noted above in HPI , .    Social History:  Reviewed by me    Social History   Tobacco Use  . Smoking status: Current Every Day Smoker    Packs/day: 1.00    Types: Cigarettes  . Smokeless tobacco: Never Used  Substance Use Topics  . Alcohol use: Yes    Comment: daily 3-40oz daily       Family History :  Reviewed by me  Alcohol abuse and hypertension  Home Medications:   Prior to Admission medications   Medication Sig Start Date End Date Taking? Authorizing Provider  medroxyPROGESTERone (DEPO-PROVERA) 150 MG/ML injection Inject 150 mg into the muscle every 3 (three) months.   Yes [provider]  pantoprazole (PROTONIX) 40 MG tablet Take 1 tablet (40  mg total) by mouth daily. 11/26/17  Yes Vassie Loll, MD  chlordiazePOXIDE (LIBRIUM) 10 MG capsule Take 1 tablet TID for 2 days; then 1 tablet twice a day for 2 days; then 1 tablet daily for 3 days and stop librium. 11/25/17   Vassie Loll, MD  feeding supplement, ENSURE ENLIVE, (ENSURE ENLIVE) LIQD Take 237 mLs by mouth 2 (two) times daily between meals. 11/25/17   Vassie Loll, MD  Multiple Vitamin (MULTIVITAMIN WITH MINERALS) TABS tablet Take 1 tablet by mouth daily. 11/26/17   Vassie Loll, MD  nicotine (NICODERM CQ - DOSED IN MG/24 HOURS) 21 mg/24hr patch Place 1 patch (21 mg total) onto the skin daily. 11/26/17   Vassie Loll, MD     Allergies:     Allergies  Allergen Reactions  . Hydrocodone Itching     Physical Exam:   Vitals  Blood pressure 99/67, pulse 89, temperature 98.3 F (36.8 C), temperature source Oral, resp. rate 14, height 5\' 2"  (1.575 m), weight 42.8 kg, SpO2 100 %.  Physical Examination: General appearance - alert, in no distress  Mental status - alert, oriented to person, place, and time,  Eyes - sclera anicteric Neck - supple, no JVD elevation , Chest - clear  to auscultation bilaterally, symmetrical air movement,  Heart - S1 and S2 normal, regular Abdomen - soft, epigastric and left upper quadrant tenderness without rebound or guarding nondistended, +BS, ND Neurological - screening mental status exam normal, neck supple without rigidity, cranial nerves II through XII intact, DTR's normal and symmetric Extremities - no pedal edema noted, intact peripheral pulses  Skin - warm, dry     Data Review:    CBC Recent Labs  Lab 05/14/18 1406  WBC 12.8*  HGB 15.9*  HCT 42.3  PLT 317  MCV 103.4*  MCH 38.9*  MCHC 37.6*  RDW 13.0   ------------------------------------------------------------------------------------------------------------------  Chemistries  Recent Labs  Lab 05/14/18 1406  NA 128*  K <2.0*  CL 69*  CO2 42*  GLUCOSE 107*  BUN 7   CREATININE 0.64  CALCIUM 8.8*  MG 1.9  AST 75*  ALT 39  ALKPHOS 120  BILITOT 2.9*   ------------------------------------------------------------------------------------------------------------------ estimated creatinine clearance is 67.6 mL/min (by C-G formula based on SCr of 0.64 mg/dL). ------------------------------------------------------------------------------------------------------------------ No results for input(s): TSH, T4TOTAL, T3FREE, THYROIDAB in the last 72 hours.  Invalid input(s): FREET3   Coagulation profile No results for input(s): INR, PROTIME in the last 168 hours. ------------------------------------------------------------------------------------------------------------------- No results for input(s): DDIMER in the last 72 hours. -------------------------------------------------------------------------------------------------------------------  Cardiac Enzymes No results for input(s): CKMB, TROPONINI, MYOGLOBIN in the last 168 hours.  Invalid input(s): CK ------------------------------------------------------------------------------------------------------------------ No  results found for: BNP  -------------------------------------------------------------------------------------------------------------  Urinalysis    Component Value Date/Time   COLORURINE AMBER (A) 11/23/2017 1331   APPEARANCEUR HAZY (A) 11/23/2017 1331   LABSPEC 1.029 11/23/2017 1331   PHURINE 5.0 11/23/2017 1331   GLUCOSEU NEGATIVE 11/23/2017 1331   HGBUR SMALL (A) 11/23/2017 1331   BILIRUBINUR MODERATE (A) 11/23/2017 1331   KETONESUR 20 (A) 11/23/2017 1331   PROTEINUR 30 (A) 11/23/2017 1331   NITRITE NEGATIVE 11/23/2017 1331   LEUKOCYTESUR SMALL (A) 11/23/2017 1331    ----------------------------------------------------------------------------------------------------------------   Imaging Results:    No results found.  Radiological Exams on Admission: No results  found.  DVT Prophylaxis -SCD /Heparin AM Labs Ordered, also please review Full Orders  Family Communication: Admission, patients condition and plan of care including tests being ordered have been discussed with the patient and s/o Mitchell at bedside who indicate understanding and agree with the plan   Code Status - Full Code  Likely DC to  home  Condition   stable  Shon Hale M.D on 05/14/2018 at 8:37 PM Pager---[phone] Go to [email or web address] - [email or web address] for contact info  [facility] - Office  [phone]

## 2018-05-14 NOTE — ED Provider Notes (Signed)
Medical screening examination/treatment/procedure(s) were conducted as a shared visit with non-physician practitioner(s) and myself.  I personally evaluated the patient during the encounter.  Clinical Impression:   Final diagnoses:  Hypokalemia  Nausea vomiting and diarrhea  Alcohol abuse    The patient is a 32 year old female, she has a well-known history of severe alcohol abuse, she endorses having multiple large cans of beer per day and over the last 3 weeks has had persistent vomiting, her last significant drink was 2 days ago but she has continued to take sips of alcohol to prevent withdrawal.  She has had increasing vomiting, increasing weakness, some intermittent hand cramping, muscle cramping and lightheadedness.  On my exam she is slightly hypotensive, her vital signs are otherwise unremarkable, her EKG is significantly abnormal with an abnormal QTC which is prolonged at 555.  Labs reviewed that she is hypokalemic, she is requiring IV magnesium, IV potassium supplementation, a banana bag with thiamine and folic acid.  She will need to be admitted to the hospital onto the hospitalist service on telemetry due to the severe electrode abnormality.  The patient is critically ill.  .Critical Care Performed by: Eber Hong, MD Authorized by: Eber Hong, MD   Critical care provider statement:    Critical care time (minutes):  35   Critical care time was exclusive of:  Separately billable procedures and treating other patients and teaching time   Critical care was necessary to treat or prevent imminent or life-threatening deterioration of the following conditions: electrolyte abnormality.   Critical care was time spent personally by me on the following activities:  Blood draw for specimens, development of treatment plan with patient or surrogate, discussions with consultants, evaluation of patient's response to treatment, examination of patient, obtaining history from patient or  surrogate, ordering and performing treatments and interventions, ordering and review of laboratory studies, ordering and review of radiographic studies, pulse oximetry, re-evaluation of patient's condition and review of old charts      Eber Hong, MD 05/14/18 [phone]

## 2018-05-14 NOTE — ED Notes (Signed)
Called AC for IV bag

## 2018-05-14 NOTE — Plan of Care (Signed)
  Problem: Clinical Measurements: Goal: Ability to maintain clinical measurements within normal limits will improve Outcome: Progressing   

## 2018-05-14 NOTE — ED Provider Notes (Signed)
[facility] EMERGENCY DEPARTMENT Provider Note   CSN: [id number] Arrival date & time: 05/14/18  1331     History   Chief Complaint Chief Complaint  Patient presents with  . Abdominal Pain    HPI Alison Johnson is a 32 y.o. female.  32 year old female, with PMH of alcohol abuse, presents with a 3-week history of nausea, vomiting and diarrhea.  Patient states symptoms have significantly worsened over the last 2 days and she is unable to keep anything down.  She states proximately 15 episodes of vomiting and a 24-hour.  And 3 episodes of diarrhea.  She denies any hematemesis, hematochezia, melena.  She has associated diffuse abdominal cramping.  She states she tried to drink yesterday but was unable to keep any alcohol down.  She denies any fevers, chills.  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  The history is provided by the patient.  Abdominal Pain   The current episode started more than 1 week ago. The problem occurs daily. The problem has been gradually worsening. The pain is located in the generalized abdominal region. Associated symptoms include diarrhea, nausea and vomiting. Pertinent negatives include fever, dysuria and frequency.    Past Medical History:  Diagnosis Date  . Cataract   . Gastritis     Patient Active Problem List   Diagnosis Date Noted  . Alcohol abuse   . Protein-calorie malnutrition, severe 11/24/2017  . Hypokalemia 11/23/2017  . Alcohol use 11/23/2017  . Gastritis 11/23/2017  . Nausea and vomiting 11/23/2017  . Macrocytosis without anemia 11/23/2017  . Metabolic acidosis 11/23/2017  . Closed fracture of proximal end of right humerus with routine healing 09/04/17 09/22/2017    Past Surgical History:  Procedure Laterality Date  . EYE SURGERY       OB History    Gravida  2   Para      Term      Preterm      AB      Living  2     SAB      TAB      Ectopic      Multiple        Live Births               Home Medications    Prior to Admission medications   Medication Sig Start Date End Date Taking? Authorizing Provider  medroxyPROGESTERone (DEPO-PROVERA) 150 MG/ML injection Inject 150 mg into the muscle every 3 (three) months.   Yes [provider]  pantoprazole (PROTONIX) 40 MG tablet Take 1 tablet (40 mg total) by mouth daily. 11/26/17  Yes Vassie Loll, MD  chlordiazePOXIDE (LIBRIUM) 10 MG capsule Take 1 tablet TID for 2 days; then 1 tablet twice a day for 2 days; then 1 tablet daily for 3 days and stop librium. 11/25/17   Vassie Loll, MD  feeding supplement, ENSURE ENLIVE, (ENSURE ENLIVE) LIQD Take 237 mLs by mouth 2 (two) times daily between meals. 11/25/17   Vassie Loll, MD  Multiple Vitamin (MULTIVITAMIN WITH MINERALS) TABS tablet Take 1 tablet by mouth daily. 11/26/17   Vassie Loll, MD  nicotine (NICODERM CQ - DOSED IN MG/24 HOURS) 21 mg/24hr patch Place 1 patch (21 mg total) onto the skin daily. 11/26/17   Vassie Loll, MD    Family History History reviewed. No pertinent family history.  Social History Social History   Tobacco Use  . Smoking status: Current Every  Day Smoker    Packs/day: 1.00    Types: Cigarettes  . Smokeless tobacco: Never Used  Substance Use Topics  . Alcohol use: Yes    Comment: daily 3-40oz daily  . Drug use: Yes    Types: Marijuana     Allergies   Hydrocodone   Review of Systems Review of Systems  Constitutional: Negative for chills and fever.  Respiratory: Negative for shortness of breath.   Cardiovascular: Negative for chest pain.  Gastrointestinal: Positive for abdominal pain, diarrhea, nausea and vomiting.  Genitourinary: Negative for dysuria, flank pain and frequency.  Skin: Negative for rash and wound.  All other systems reviewed and are negative.    Physical Exam Updated Vital Signs BP 99/67   Pulse 87   Temp 98.3 F (36.8 C) (Oral)   Resp 16   Ht 5\' 2"  (1.575 m)   Wt 42.8  kg   SpO2 100%   BMI 17.25 kg/m   Physical Exam  Constitutional: She is oriented to person, place, and time. She appears well-developed and well-nourished.  HENT:  Head: Normocephalic and atraumatic.  Eyes: Conjunctivae and EOM are normal.  Neck: Neck supple.  Cardiovascular: Normal rate, regular rhythm and normal heart sounds.  No murmur heard. Pulmonary/Chest: Effort normal and breath sounds normal. No respiratory distress. She has no wheezes. She has no rales.  Abdominal: Soft. Bowel sounds are normal. She exhibits no distension. There is tenderness (mildly tender throughout abdomen). There is no rigidity, no rebound, no guarding, no tenderness at McBurney's point and negative Murphy's sign.  Musculoskeletal: Normal range of motion. She exhibits no tenderness or deformity.  Neurological: She is alert and oriented to person, place, and time.  Skin: Skin is warm and dry. No rash noted. No erythema.  Psychiatric: She has a normal mood and affect. Her behavior is normal.  Nursing note and vitals reviewed.    ED Treatments / Results  Labs (all labs ordered are listed, but only abnormal results are displayed) Labs Reviewed  COMPREHENSIVE METABOLIC PANEL - Abnormal; Notable for the following components:      Result Value   Sodium 128 (*)    Potassium <2.0 (*)    Chloride 69 (*)    CO2 42 (*)    Glucose, Bld 107 (*)    Calcium 8.8 (*)    AST 75 (*)    Total Bilirubin 2.9 (*)    Anion gap 17 (*)    All other components within normal limits  CBC - Abnormal; Notable for the following components:   WBC 12.8 (*)    Hemoglobin 15.9 (*)    MCV 103.4 (*)    MCH 38.9 (*)    MCHC 37.6 (*)    All other components within normal limits  LIPASE, BLOOD  MAGNESIUM  URINALYSIS, ROUTINE W REFLEX MICROSCOPIC  BASIC METABOLIC PANEL  CBC  I-STAT BETA HCG BLOOD, ED ([facility], [facility], [facility] ONLY)    EKG None  Radiology No results found.  Procedures .Critical Care Performed by: Clayborne Artist, PA-C Authorized by: Clayborne Artist, PA-C   Critical care provider statement:    Critical care time (minutes):  45   Critical care start time:  05/14/2018 5:00 PM   Critical care end time:  05/14/2018 6:41 PM   Critical care was necessary to treat or prevent imminent or life-threatening deterioration of the following conditions:  Cardiac failure   Critical care was time spent personally by me on the following activities:  Discussions  with consultants, evaluation of patient's response to treatment, examination of patient, ordering and performing treatments and interventions, ordering and review of laboratory studies, ordering and review of radiographic studies, pulse oximetry, re-evaluation of patient's condition, obtaining history from patient or surrogate and review of old charts   (including critical care time)  Medications Ordered in ED Medications  magnesium sulfate IVPB 2 g 50 mL (has no administration in time range)  potassium chloride 10 mEq in 100 mL IVPB (10 mEq Intravenous New Bag/Given 05/14/18 1816)  pantoprazole (PROTONIX) EC tablet 40 mg (has no administration in time range)  sodium chloride flush (NS) 0.9 % injection 3 mL (has no administration in time range)  sodium chloride flush (NS) 0.9 % injection 3 mL (has no administration in time range)  0.9 %  sodium chloride infusion (has no administration in time range)  acetaminophen (TYLENOL) tablet 650 mg (has no administration in time range)    Or  acetaminophen (TYLENOL) suppository 650 mg (has no administration in time range)  traZODone (DESYREL) tablet 50 mg (has no administration in time range)  polyethylene glycol (MIRALAX / GLYCOLAX) packet 17 g (has no administration in time range)  ondansetron (ZOFRAN) tablet 4 mg (has no administration in time range)    Or  ondansetron (ZOFRAN) injection 4 mg (has no administration in time range)  albuterol (PROVENTIL) (2.5 MG/3ML) 0.083% nebulizer solution 2.5 mg (has no  administration in time range)  heparin injection 5,000 Units (has no administration in time range)  sodium chloride 0.9 % 1,000 mL with thiamine 100 mg, folic acid 1 mg, multivitamins adult 10 mL infusion (has no administration in time range)  dextrose 5 % and 0.45 % NaCl with KCl 40 mEq/L infusion (has no administration in time range)  LORazepam (ATIVAN) tablet 1 mg (has no administration in time range)    Or  LORazepam (ATIVAN) injection 1 mg (has no administration in time range)  thiamine (VITAMIN B-1) tablet 100 mg (has no administration in time range)    Or  thiamine (B-1) injection 100 mg (has no administration in time range)  folic acid (FOLVITE) tablet 1 mg (has no administration in time range)  multivitamin with minerals tablet 1 tablet (has no administration in time range)  potassium chloride SA (K-DUR,KLOR-CON) CR tablet 40 mEq (has no administration in time range)  sodium chloride 0.9 % 1,000 mL with thiamine 100 mg, folic acid 1 mg, multivitamins adult 10 mL infusion ( Intravenous New Bag/Given 05/14/18 1828)  potassium chloride SA (K-DUR,KLOR-CON) CR tablet 40 mEq (40 mEq Oral Given 05/14/18 1812)  promethazine (PHENERGAN) injection 25 mg (25 mg Intravenous Given 05/14/18 1852)     Initial Impression / Assessment and Plan / ED Course  I have reviewed the triage vital signs and the nursing notes.  Pertinent labs & imaging results that were available during my care of the patient were reviewed by me and considered in my medical decision making (see chart for details).     Noted to have significant hypokalemia.  EKG shows QTC prolongation.  Case with Dr. Hyacinth Meeker recommends magnesium in addition to potassium.  Due to critical hypokalemia and risk of life-threatening arrhythmia, this visit required critical care time.  Discussed with the hospitalist who will admit the patient.  Final Clinical Impressions(s) / ED Diagnoses   Final diagnoses:  Hypokalemia  Nausea vomiting and  diarrhea  Alcohol abuse    ED Discharge Orders    None       Zeyad Delaguila S,  PA-C 05/14/18 2152    Eber Hong, MD 05/15/18 1435

## 2018-05-14 NOTE — ED Notes (Signed)
CRITICAL VALUE ALERT  Critical Value:  K <2.0  Date & Time Notied:  05/14/18 1750  Provider Notified: Birdie Riddle, PA notified   Orders Received/Actions taken: na

## 2018-05-14 NOTE — ED Triage Notes (Signed)
Patient c/o generalized abd pain with nausea, vomiting and diarrhea x3 weeks. Per patient unable to eat in past 6 days and has lost lost 6lbs in past 2 weeks. Unsure of any fevers. Hx of gastritis. Patient states generalized fatigue and dehydration.

## 2018-05-14 NOTE — ED Notes (Signed)
Attempted to call report, was told that nurse still in shift change report

## 2018-05-15 DIAGNOSIS — R197 Diarrhea, unspecified: Secondary | ICD-10-CM

## 2018-05-15 DIAGNOSIS — F1010 Alcohol abuse, uncomplicated: Secondary | ICD-10-CM

## 2018-05-15 DIAGNOSIS — R112 Nausea with vomiting, unspecified: Secondary | ICD-10-CM

## 2018-05-15 LAB — CBC
HCT: 34.6 % — ABNORMAL LOW (ref 36.0–46.0)
HEMATOCRIT: 33.2 % — AB (ref 36.0–46.0)
HEMOGLOBIN: 12.2 g/dL (ref 12.0–15.0)
HEMOGLOBIN: 11.7 g/dL — AB (ref 12.0–15.0)
MCH: 38.7 pg — ABNORMAL HIGH (ref 26.0–34.0)
MCH: 39.9 pg — AB (ref 26.0–34.0)
MCHC: 35.3 g/dL (ref 30.0–36.0)
MCHC: 35.2 g/dL (ref 30.0–36.0)
MCV: 109.8 fL — AB (ref 80.0–100.0)
MCV: 113.3 fL — ABNORMAL HIGH (ref 80.0–100.0)
NRBC: 0 % (ref 0.0–0.2)
NRBC: 0 % (ref 0.0–0.2)
PLATELETS: 239 10*3/uL (ref 150–400)
Platelets: 253 10*3/uL (ref 150–400)
RBC: 3.15 MIL/uL — AB (ref 3.87–5.11)
RBC: 2.93 MIL/uL — AB (ref 3.87–5.11)
RDW: 13.5 % (ref 11.5–15.5)
RDW: 13.5 % (ref 11.5–15.5)
WBC: 8.4 10*3/uL (ref 4.0–10.5)
WBC: 6.8 10*3/uL (ref 4.0–10.5)

## 2018-05-15 LAB — BASIC METABOLIC PANEL
Anion gap: 7 (ref 5–15)
BUN: <5 mg/dL — ABNORMAL LOW (ref 6–20)
CHLORIDE: 96 mmol/L — AB (ref 98–111)
CO2: 32 mmol/L (ref 22–32)
CREATININE: 0.5 mg/dL (ref 0.44–1.00)
Calcium: 7.5 mg/dL — ABNORMAL LOW (ref 8.9–10.3)
GFR calc non Af Amer: >60 mL/min (ref >60)
Glucose, Bld: 110 mg/dL — ABNORMAL HIGH (ref 70–99)
POTASSIUM: 3.3 mmol/L — AB (ref 3.5–5.1)
SODIUM: 135 mmol/L (ref 135–145)

## 2018-05-15 MED ORDER — POTASSIUM CHLORIDE CRYS ER 20 MEQ PO TBCR
40.0000 meq | EXTENDED_RELEASE_TABLET | ORAL | Status: AC
  Administered 2018-05-15 (×2): 40 meq via ORAL
  Filled 2018-05-15 (×2): qty 2

## 2018-05-15 MED ORDER — THIAMINE HCL 100 MG PO TABS: 100.0000 mg | ORAL_TABLET | Freq: Every day | ORAL | Status: DC

## 2018-05-15 MED ORDER — ALUM & MAG HYDROXIDE-SIMETH 200-200-20 MG/5ML PO SUSP
30.0000 mL | Freq: Four times a day (QID) | ORAL | Status: DC | PRN
  Administered 2018-05-15: 30 mL via ORAL
  Filled 2018-05-15: qty 30

## 2018-05-15 MED ORDER — FOLIC ACID 1 MG PO TABS: 1.0000 mg | ORAL_TABLET | Freq: Every day | ORAL | Status: DC

## 2018-05-15 MED ORDER — POTASSIUM CHLORIDE 10 MEQ/100ML IV SOLN
10.0000 meq | INTRAVENOUS | Status: AC
  Administered 2018-05-15 (×6): 10 meq via INTRAVENOUS
  Filled 2018-05-15 (×6): qty 100

## 2018-05-15 NOTE — Discharge Summary (Signed)
Physician Discharge Summary  Alison Johnson ZOX:[id number] DOB: [birth year] DOA: 05/14/2018  PCP: Vertis Kelch, NP  Admit date: 05/14/2018 Discharge date: 05/15/2018  Time spent: 45 minutes  Recommendations for Outpatient Follow-up:  -Will be discharged home today. -Advised follow-up with PCP in 2 weeks.  Discharge Diagnoses:  Principal Problem:   Hypokalemia Active Problems:   Alcohol use   Nausea and vomiting   Discharge Condition: Stable and improved  Filed Weights   05/14/18 1402 05/14/18 2040  Weight: 42.8 kg 43 kg    History of present illness:  As per Dr. Mariea Clonts on 10/20: Alison Johnson  is a 32 y.o. female with past medical history relevant for alcohol and tobacco abuse who presents to the ED with persistent intractable emesis for the last 3 weeks also with intermittent loose stools  No sick contacts at home, no fevers no chills, emesis is without bile or blood, diarrhea is without mucus or blood  She reports epigastric discomfort, poor oral intake, and 6 pound weight loss and fatigue  Additional history obtained from her significant other Mitchel at bedside  No frank chest pains no palpitations, no shortness of breath no pleuritic symptoms no leg pains or leg swelling  In ED... Patient is found to have significant electrolyte derangement with potassium less than 2, chloride of 69, sodium of 128, LFTs are elevated in the pattern consistent with alcoholic liver disease, MCV and MCH are elevated consistent with EtOH abuse  In ED.... EKG noted, patient received IV magnesium and potassium on admission was requested  Hospital Course:   Severe electrolyte derangement -Patient admission was found to be hypokalemic, hyponatremic and hypochloremic likely on account to GI losses with vomiting and diarrhea. -Her potassium was 1.4.  Sodium was 128 and chloride was 69 on admission.  She received aggressive IV fluid hydration as well as potassium  repletion. -Potassium on discharge is 3.3, she has received 40 mEq prior to discharge.  Sodium is 135, chloride is 98. -She no longer has had emesis or diarrhea since admission.  Alcoholic gastritis -This is likely the cause of her emesis -Have advised omeprazole 20 mg twice daily as well as alcohol cessation. -For alcohol abuse have recommended that she take thiamine and folate, she has not exhibited signs of withdrawal while hospitalized. -She has voiced wanting to go to a detox center, have involved social work and she has been given resources.  Procedures:  None  Consultations:  None  Discharge Instructions  Discharge Instructions    Diet - low sodium heart healthy   Complete by:  As directed    Increase activity slowly   Complete by:  As directed      Allergies as of 05/15/2018      Reactions   Hydrocodone Itching      Medication List    TAKE these medications   chlordiazePOXIDE 10 MG capsule Commonly known as:  LIBRIUM Take 1 tablet TID for 2 days; then 1 tablet twice a day for 2 days; then 1 tablet daily for 3 days and stop librium.   feeding supplement (ENSURE ENLIVE) Liqd Take 237 mLs by mouth 2 (two) times daily between meals.   folic acid 1 MG tablet Commonly known as:  FOLVITE Take 1 tablet (1 mg total) by mouth daily. Start taking on:  05/16/2018   medroxyPROGESTERone 150 MG/ML injection Commonly known as:  DEPO-PROVERA Inject 150 mg into the muscle every 3 (three) months.   multivitamin with minerals Tabs tablet Take  1 tablet by mouth daily.   nicotine 21 mg/24hr patch Commonly known as:  NICODERM CQ - dosed in mg/24 hours Place 1 patch (21 mg total) onto the skin daily.   pantoprazole 40 MG tablet Commonly known as:  PROTONIX Take 1 tablet (40 mg total) by mouth daily.   thiamine 100 MG tablet Take 1 tablet (100 mg total) by mouth daily. Start taking on:  05/16/2018      Allergies  Allergen Reactions  . Hydrocodone Itching    Follow-up Information    Vertis Kelch, NP. Schedule an appointment as soon as possible for a visit in 2 week(s).   Specialty:  Nurse Practitioner Contact information: [address] [place] [address] [phone]            The results of significant diagnostics from this hospitalization (including imaging, microbiology, ancillary and laboratory) are listed below for reference.    Significant Diagnostic Studies: No results found.  Microbiology: No results found for this or any previous visit (from the past 240 hour(s)).   Labs: Basic Metabolic Panel: Recent Labs  Lab 05/14/18 1406 05/14/18 2301 05/15/18 0632  NA 128* 129* 135  K <2.0* 1.4* 3.3*  CL 69* 79* 96*  CO2 42* 38* 32  GLUCOSE 107* 133* 110*  BUN 7 6 <5*  CREATININE 0.64 0.58 0.50  CALCIUM 8.8* 7.4* 7.5*  MG 1.9  --   --    Liver Function Tests: Recent Labs  Lab 05/14/18 1406  AST 75*  ALT 39  ALKPHOS 120  BILITOT 2.9*  PROT 7.8  ALBUMIN 3.8   Recent Labs  Lab 05/14/18 1406  LIPASE 29   No results for input(s): AMMONIA in the last 168 hours. CBC: Recent Labs  Lab 05/14/18 1406 05/14/18 2301 05/15/18 1126  WBC 12.8* 8.4 6.8  HGB 15.9* 12.2 11.7*  HCT 42.3 34.6* 33.2*  MCV 103.4* 109.8* 113.3*  PLT 317 253 239   Cardiac Enzymes: No results for input(s): CKTOTAL, CKMB, CKMBINDEX, TROPONINI in the last 168 hours. BNP: BNP (last 3 results) No results for input(s): BNP in the last 8760 hours.  ProBNP (last 3 results) No results for input(s): PROBNP in the last 8760 hours.  CBG: No results for input(s): GLUCAP in the last 168 hours.     Signed:  Chaya Jan  [facility] Pager: [phone] 05/15/2018, 12:31 PM

## 2018-05-15 NOTE — Progress Notes (Signed)
Pt's IV catheter removed and intact. Pt's IV site clean dry and intact. Discharge instructions including medications and follow up appointments were reviewed and discussed with patient. All questions were answered and no further questions at this time. Pt escorted by RN.

## 2018-05-15 NOTE — Clinical Social Work Note (Signed)
Patient referred to CSW due to inability to afford medications. This is a CM function. CM is aware and will address need as resources are available.    Valdemar Mcclenahan, Juleen China, LCSW

## 2018-05-15 NOTE — Care Management Note (Addendum)
Case Management Note  Patient Details  Name: Alison Johnson MRN: [id number] Date of Birth: 10/29/1985  Subjective/Objective:       Admitted with hypokalemia. CM consult for med assistance. Pt has PCP at [facility], they have no assistance available to her. She has applied for medicaid mult times but does not qualify d/t her income. She is unemployed but gets SSI from her childs spouse (who died) plus another income source. Per MD pt will DC with omeprazole which is available over the counter. CM has no assistance available for OTC meds.              Action/Plan: DC home today with self care. CM will provide resources for [place] [facility] assist, [facility] care, [facility] $4 list, and contact info for care connects.   Expected Discharge Date:  05/15/18               Expected Discharge Plan:  Home/Self Care  In-House Referral:  NA  Discharge planning Services  CM Consult  Post Acute Care Choice:  NA Choice offered to:  NA  Status of Service:  Completed, signed off  Malcolm Metro, RN 05/15/2018, 11:43 AM

## 2018-05-15 NOTE — Progress Notes (Signed)
CRITICAL VALUE ALERT  Critical Value potassium 1.4 Date & Time Notied:  05/14/18 2335  Provider Notified: Rana Snare NP  Orders Received/Actions taken: yes

## 2018-05-15 NOTE — Clinical Social Work Note (Signed)
LCSW met with patient at attending's request regarding detox resources. LCSW provided patient with inpatient detox resources. LCSW contacted [facility] and sent clinicals to [facility] with [facility]. LCSW provided patient with the phone number to [facility] to complete the patient screening portion.  LCSW advised that patient could wait and complete the patient screening in the hospital so that if patient was accepted we could problem solve transportation issues with her.  Patient became upset about not being prescribed Librium and stated that she would leave and do the screening once she left.   LCSW signing off.    Gaelen Brager, Clydene Pugh, LCSW

## 2018-07-05 ENCOUNTER — Other Ambulatory Visit: Payer: Self-pay | Admitting: Orthopaedic Surgery

## 2018-07-05 ENCOUNTER — Ambulatory Visit (INDEPENDENT_AMBULATORY_CARE_PROVIDER_SITE_OTHER): Payer: Self-pay

## 2018-07-05 ENCOUNTER — Ambulatory Visit: Payer: Self-pay | Admitting: Orthopaedic Surgery

## 2018-07-05 ENCOUNTER — Encounter: Payer: Self-pay | Admitting: Orthopaedic Surgery

## 2018-07-05 VITALS — BP 100/66 | HR 80 | Ht 62.0 in | Wt 102.0 lb

## 2018-07-05 DIAGNOSIS — M79675 Pain in left toe(s): Secondary | ICD-10-CM

## 2018-07-05 DIAGNOSIS — S92415A Nondisplaced fracture of proximal phalanx of left great toe, initial encounter for closed fracture: Secondary | ICD-10-CM

## 2018-07-05 DIAGNOSIS — M79674 Pain in right toe(s): Secondary | ICD-10-CM

## 2018-07-05 NOTE — Progress Notes (Signed)
Patient Alison Johnson, female DOB:07/15/1986, 32 y.o. WGN:[id number]  Chief Complaint  Patient presents with  . New Patient (Initial Visit)    left big toe    HPI  Alison Johnson is a 32 y.o. female who hurt her left great toe horsing around with a family member the other day.  She has swelling and bluish color of the toe at the IP joint.  She has no other injury.   Body mass index is 18.66 kg/m.  ROS  Review of Systems  Eyes: Positive for visual disturbance.       Congenital blindness right eye.  Musculoskeletal: Positive for arthralgias and joint swelling.    All other systems reviewed and are negative.  The following is a summary of the past history medically, past history surgically, known current medicines, social history and family history.  This information is gathered electronically by the computer from prior information and documentation.  I review this each visit and have found including this information at this point in the chart is beneficial and informative.    Past Medical History:  Diagnosis Date  . Alcoholism (HCC)   . Cataract   . Chronic mental illness   . COPD (chronic obstructive pulmonary disease) (HCC)   . Depression   . Fractures   . Gastritis     Past Surgical History:  Procedure Laterality Date  . CATARACT EXTRACTION    . EYE SURGERY      Family History  Problem Relation Age of Onset  . COPD Father   . Depression Father   . Heart attack Father     Social History Social History   Tobacco Use  . Smoking status: Current Every Day Smoker    Packs/day: 1.00    Types: Cigarettes  . Smokeless tobacco: Never Used  Substance Use Topics  . Alcohol use: Yes    Comment: daily 3-40oz daily  . Drug use: Yes    Types: Marijuana    Allergies  Allergen Reactions  . Hydrocodone Itching    Current Outpatient Medications  Medication Sig Dispense Refill  . chlordiazePOXIDE (LIBRIUM) 10 MG capsule Take 1 tablet TID for 2 days; then 1  tablet twice a day for 2 days; then 1 tablet daily for 3 days and stop librium. 15 capsule 0  . feeding supplement, ENSURE ENLIVE, (ENSURE ENLIVE) LIQD Take 237 mLs by mouth 2 (two) times daily between meals.    . folic acid (FOLVITE) 1 MG tablet Take 1 tablet (1 mg total) by mouth daily.    . medroxyPROGESTERone (DEPO-PROVERA) 150 MG/ML injection Inject 150 mg into the muscle every 3 (three) months.    . Multiple Vitamin (MULTIVITAMIN WITH MINERALS) TABS tablet Take 1 tablet by mouth daily. 30 tablet 1  . nicotine (NICODERM CQ - DOSED IN MG/24 HOURS) 21 mg/24hr patch Place 1 patch (21 mg total) onto the skin daily. 28 patch 1  . pantoprazole (PROTONIX) 40 MG tablet Take 1 tablet (40 mg total) by mouth daily. 30 tablet 1  . thiamine 100 MG tablet Take 1 tablet (100 mg total) by mouth daily.     No current facility-administered medications for this visit.      Physical Exam  Blood pressure 100/66, pulse 80, height 5\' 2"  (1.575 m), weight 102 lb (46.3 kg).  Constitutional: overall normal hygiene, normal nutrition, well developed, normal grooming, normal body habitus. Assistive device:crutches  Musculoskeletal: gait and station Limp left, muscle tone and strength are normal, no tremors or  atrophy is present.  .  Neurological: coordination overall normal.  Deep tendon reflex/nerve stretch intact.  Sensation normal.  Cranial nerves II-XII intact.   Skin:   Normal overall no scars, lesions, ulcers or rashes. No psoriasis.  Psychiatric: Alert and oriented x 3.  Recent memory intact, remote memory unclear.  Normal mood and affect. Well groomed.  Good eye contact.  Cardiovascular: overall no swelling, no varicosities, no edema bilaterally, normal temperatures of the legs and arms, no clubbing, cyanosis and good capillary refill.  Lymphatic: palpation is normal.  Left great toe has some swelling and ecchymosis at the IP joint medially.  ROM is decreased secondary to pain.  NV intact.  All  other systems reviewed and are negative   The patient has been educated about the nature of the problem(s) and counseled on treatment options.  The patient appeared to understand what I have discussed and is in agreement with it.  Encounter Diagnoses  Name Primary?  . Closed nondisplaced fracture of proximal phalanx of left great toe, initial encounter   . Pain of toe of right foot Yes   X-rays of the left toe great were done, reported separately.  PLAN Call if any problems.  Precautions discussed.  Continue current medications.   Return to clinic 1 week   X-ray of the great toe on return.  A CAM walker was given.  Tylenol or Advil for pain.  Electronically Signed Darreld McleanWayne Keyanni Whittinghill, MD 12/11/20193:01 PM

## 2018-07-13 ENCOUNTER — Ambulatory Visit: Payer: Self-pay | Admitting: Orthopaedic Surgery

## 2018-07-27 ENCOUNTER — Encounter: Payer: Self-pay | Admitting: Orthopaedic Surgery

## 2018-07-27 ENCOUNTER — Ambulatory Visit: Payer: Self-pay | Admitting: Orthopaedic Surgery

## 2018-09-22 ENCOUNTER — Encounter (HOSPITAL_COMMUNITY): Payer: Self-pay | Admitting: *Deleted

## 2018-09-22 ENCOUNTER — Inpatient Hospital Stay (HOSPITAL_COMMUNITY)
Admission: EM | Admit: 2018-09-22 | Discharge: 2018-09-25 | DRG: 641 | Disposition: A | Discharge status: Home / Self Care | Payer: Medicaid Other | Attending: Family Medicine | Admitting: Family Medicine

## 2018-09-22 ENCOUNTER — Other Ambulatory Visit: Payer: Self-pay

## 2018-09-22 DIAGNOSIS — Z825 Family history of asthma and other chronic lower respiratory diseases: Secondary | ICD-10-CM

## 2018-09-22 DIAGNOSIS — Z79899 Other long term (current) drug therapy: Secondary | ICD-10-CM

## 2018-09-22 DIAGNOSIS — Z681 Body mass index (BMI) 19 or less, adult: Secondary | ICD-10-CM | POA: Diagnosis not present

## 2018-09-22 DIAGNOSIS — Z885 Allergy status to narcotic agent status: Secondary | ICD-10-CM | POA: Diagnosis not present

## 2018-09-22 DIAGNOSIS — E876 Hypokalemia: Secondary | ICD-10-CM | POA: Diagnosis present

## 2018-09-22 DIAGNOSIS — E860 Dehydration: Secondary | ICD-10-CM | POA: Diagnosis present

## 2018-09-22 DIAGNOSIS — F17210 Nicotine dependence, cigarettes, uncomplicated: Secondary | ICD-10-CM | POA: Diagnosis present

## 2018-09-22 DIAGNOSIS — R710 Precipitous drop in hematocrit: Secondary | ICD-10-CM | POA: Diagnosis not present

## 2018-09-22 DIAGNOSIS — R627 Adult failure to thrive: Secondary | ICD-10-CM | POA: Diagnosis present

## 2018-09-22 DIAGNOSIS — F329 Major depressive disorder, single episode, unspecified: Secondary | ICD-10-CM | POA: Diagnosis present

## 2018-09-22 DIAGNOSIS — K219 Gastro-esophageal reflux disease without esophagitis: Secondary | ICD-10-CM | POA: Diagnosis present

## 2018-09-22 DIAGNOSIS — F10239 Alcohol dependence with withdrawal, unspecified: Secondary | ICD-10-CM | POA: Diagnosis present

## 2018-09-22 DIAGNOSIS — R112 Nausea with vomiting, unspecified: Secondary | ICD-10-CM | POA: Diagnosis present

## 2018-09-22 DIAGNOSIS — F1010 Alcohol abuse, uncomplicated: Secondary | ICD-10-CM | POA: Diagnosis present

## 2018-09-22 DIAGNOSIS — J449 Chronic obstructive pulmonary disease, unspecified: Secondary | ICD-10-CM | POA: Diagnosis present

## 2018-09-22 DIAGNOSIS — F10939 Alcohol use, unspecified with withdrawal, unspecified: Secondary | ICD-10-CM

## 2018-09-22 LAB — CBC WITH DIFFERENTIAL/PLATELET
Abs Immature Granulocytes: 0.05 K/uL (ref 0.00–0.07)
Basophils Absolute: 0.1 K/uL (ref 0.0–0.1)
Basophils Relative: 1 %
Eosinophils Absolute: 0 K/uL (ref 0.0–0.5)
Eosinophils Relative: 0 %
HCT: 45.3 % (ref 36.0–46.0)
Hemoglobin: 15.9 g/dL — ABNORMAL HIGH (ref 12.0–15.0)
Immature Granulocytes: 0 %
Lymphocytes Relative: 20 %
Lymphs Abs: 2.4 K/uL (ref 0.7–4.0)
MCH: 36.4 pg — ABNORMAL HIGH (ref 26.0–34.0)
MCHC: 35.1 g/dL (ref 30.0–36.0)
MCV: 103.7 fL — ABNORMAL HIGH (ref 80.0–100.0)
Monocytes Absolute: 1 K/uL (ref 0.1–1.0)
Monocytes Relative: 8 %
Neutro Abs: 8.8 K/uL — ABNORMAL HIGH (ref 1.7–7.7)
Neutrophils Relative %: 71 %
Platelets: 334 K/uL (ref 150–400)
RBC: 4.37 MIL/uL (ref 3.87–5.11)
RDW: 13.7 % (ref 11.5–15.5)
WBC: 12.3 K/uL — ABNORMAL HIGH (ref 4.0–10.5)
nRBC: 0 % (ref 0.0–0.2)

## 2018-09-22 LAB — COMPREHENSIVE METABOLIC PANEL
ALBUMIN: 3.9 g/dL (ref 3.5–5.0)
ALT: 41 U/L (ref 0–44)
AST: 96 U/L — AB (ref 15–41)
Alkaline Phosphatase: 166 U/L — ABNORMAL HIGH (ref 38–126)
Anion gap: 17 — ABNORMAL HIGH (ref 5–15)
BILIRUBIN TOTAL: 2.7 mg/dL — AB (ref 0.3–1.2)
BUN: 5 mg/dL — AB (ref 6–20)
CHLORIDE: 81 mmol/L — AB (ref 98–111)
CO2: 32 mmol/L (ref 22–32)
Calcium: 9.2 mg/dL (ref 8.9–10.3)
Creatinine, Ser: 0.68 mg/dL (ref 0.44–1.00)
GFR calc Af Amer: >60 mL/min (ref >60)
GFR calc non Af Amer: >60 mL/min (ref >60)
GLUCOSE: 104 mg/dL — AB (ref 70–99)
POTASSIUM: 2.1 mmol/L — AB (ref 3.5–5.1)
Sodium: 130 mmol/L — ABNORMAL LOW (ref 135–145)
Total Protein: 8.1 g/dL (ref 6.5–8.1)

## 2018-09-22 LAB — MAGNESIUM: Magnesium: 2.4 mg/dL (ref 1.7–2.4)

## 2018-09-22 LAB — LIPASE, BLOOD: Lipase: 22 U/L (ref 11–51)

## 2018-09-22 MED ORDER — ONDANSETRON HCL 4 MG/2ML IJ SOLN
4.0000 mg | Freq: Four times a day (QID) | INTRAMUSCULAR | Status: DC | PRN
  Administered 2018-09-23: 4 mg via INTRAVENOUS
  Filled 2018-09-22: qty 2

## 2018-09-22 MED ORDER — VITAMIN B-1 100 MG PO TABS
100.0000 mg | ORAL_TABLET | Freq: Every day | ORAL | Status: DC
  Administered 2018-09-24 – 2018-09-25 (×2): 100 mg via ORAL
  Filled 2018-09-22 (×2): qty 1

## 2018-09-22 MED ORDER — POTASSIUM CHLORIDE 10 MEQ/100ML IV SOLN
10.0000 meq | INTRAVENOUS | Status: AC
  Administered 2018-09-22 – 2018-09-23 (×5): 10 meq via INTRAVENOUS
  Filled 2018-09-22 (×5): qty 100

## 2018-09-22 MED ORDER — LORAZEPAM 1 MG PO TABS
1.0000 mg | ORAL_TABLET | Freq: Four times a day (QID) | ORAL | Status: DC | PRN
  Administered 2018-09-25 (×3): 1 mg via ORAL
  Filled 2018-09-22 (×3): qty 1

## 2018-09-22 MED ORDER — ADULT MULTIVITAMIN W/MINERALS CH
1.0000 | ORAL_TABLET | Freq: Every day | ORAL | Status: DC
  Administered 2018-09-23 – 2018-09-25 (×3): 1 via ORAL
  Filled 2018-09-22 (×3): qty 1

## 2018-09-22 MED ORDER — LORAZEPAM 2 MG/ML IJ SOLN
0.5000 mg | Freq: Once | INTRAMUSCULAR | Status: AC
  Administered 2018-09-22: 0.5 mg via INTRAVENOUS
  Filled 2018-09-22: qty 1

## 2018-09-22 MED ORDER — THIAMINE HCL 100 MG/ML IJ SOLN
100.0000 mg | Freq: Every day | INTRAMUSCULAR | Status: DC
  Administered 2018-09-23: 100 mg via INTRAVENOUS
  Filled 2018-09-22: qty 2

## 2018-09-22 MED ORDER — M.V.I. ADULT IV INJ
INJECTION | INTRAVENOUS | Status: AC
  Filled 2018-09-22: qty 10

## 2018-09-22 MED ORDER — SODIUM CHLORIDE 0.9 % IV BOLUS
1000.0000 mL | Freq: Once | INTRAVENOUS | Status: AC
  Administered 2018-09-22: 1000 mL via INTRAVENOUS

## 2018-09-22 MED ORDER — THIAMINE HCL 100 MG/ML IJ SOLN
Freq: Once | INTRAVENOUS | Status: DC
  Administered 2018-09-23: via INTRAVENOUS
  Filled 2018-09-22: qty 1000

## 2018-09-22 MED ORDER — LORAZEPAM 2 MG/ML IJ SOLN
1.0000 mg | Freq: Four times a day (QID) | INTRAMUSCULAR | Status: DC | PRN
  Administered 2018-09-23 – 2018-09-24 (×5): 1 mg via INTRAVENOUS
  Filled 2018-09-22 (×5): qty 1

## 2018-09-22 MED ORDER — ONDANSETRON HCL 4 MG PO TABS: 4.0000 mg | ORAL_TABLET | Freq: Four times a day (QID) | ORAL | Status: DC | PRN

## 2018-09-22 MED ORDER — FOLIC ACID 1 MG PO TABS
1.0000 mg | ORAL_TABLET | Freq: Every day | ORAL | Status: DC
  Administered 2018-09-23 – 2018-09-25 (×3): 1 mg via ORAL
  Filled 2018-09-22 (×3): qty 1

## 2018-09-22 MED ORDER — ONDANSETRON HCL 4 MG/2ML IJ SOLN
4.0000 mg | Freq: Once | INTRAMUSCULAR | Status: AC
  Administered 2018-09-22: 4 mg via INTRAVENOUS
  Filled 2018-09-22: qty 2

## 2018-09-22 MED ORDER — THIAMINE HCL 100 MG/ML IJ SOLN
INTRAMUSCULAR | Status: AC
  Filled 2018-09-22: qty 2

## 2018-09-22 NOTE — ED Provider Notes (Signed)
[facility] EMERGENCY DEPARTMENT Provider Note   CSN: [id number] Arrival date & time: 09/22/18  1918    History   Chief Complaint Chief Complaint  Patient presents with  . Withdrawal    HPI Alison Johnson is a 33 y.o. female.     Patient presents with concerned about alcoholism, alcohol withdrawal, persistent vomiting for 2 weeks.  Last alcohol was 3 days ago.  She has been unable to keep fluids down.  She has been drinking for extra-large bottles of beer per day.  Past medical history includes malnutrition, gastritis, depression, COPD.  Severity of symptoms is moderate.  Nothing makes symptoms better or worse.     Past Medical History:  Diagnosis Date  . Alcoholism (HCC)   . Cataract   . Chronic mental illness   . COPD (chronic obstructive pulmonary disease) (HCC)   . Depression   . Fractures   . Gastritis     Patient Active Problem List   Diagnosis Date Noted  . Alcohol abuse   . Protein-calorie malnutrition, severe 11/24/2017  . Hypokalemia 11/23/2017  . Alcohol use 11/23/2017  . Gastritis 11/23/2017  . Nausea and vomiting 11/23/2017  . Macrocytosis without anemia 11/23/2017  . Metabolic acidosis 11/23/2017  . Closed fracture of proximal end of right humerus with routine healing 09/04/17 09/22/2017    Past Surgical History:  Procedure Laterality Date  . CATARACT EXTRACTION    . EYE SURGERY       OB History    Gravida  2   Para      Term      Preterm      AB      Living  2     SAB      TAB      Ectopic      Multiple      Live Births               Home Medications    Prior to Admission medications   Medication Sig Start Date End Date Taking? Authorizing Provider  chlordiazePOXIDE (LIBRIUM) 10 MG capsule Take 1 tablet TID for 2 days; then 1 tablet twice a day for 2 days; then 1 tablet daily for 3 days and stop librium. 11/25/17   Vassie Loll, MD  feeding supplement, ENSURE ENLIVE, (ENSURE ENLIVE) LIQD Take 237 mLs by mouth 2  (two) times daily between meals. 11/25/17   Vassie Loll, MD  folic acid (FOLVITE) 1 MG tablet Take 1 tablet (1 mg total) by mouth daily. 05/16/18   Philip Aspen, Limmie Patricia, MD  medroxyPROGESTERone (DEPO-PROVERA) 150 MG/ML injection Inject 150 mg into the muscle every 3 (three) months.    [provider]  Multiple Vitamin (MULTIVITAMIN WITH MINERALS) TABS tablet Take 1 tablet by mouth daily. 11/26/17   Vassie Loll, MD  nicotine (NICODERM CQ - DOSED IN MG/24 HOURS) 21 mg/24hr patch Place 1 patch (21 mg total) onto the skin daily. 11/26/17   Vassie Loll, MD  pantoprazole (PROTONIX) 40 MG tablet Take 1 tablet (40 mg total) by mouth daily. 11/26/17   Vassie Loll, MD  thiamine 100 MG tablet Take 1 tablet (100 mg total) by mouth daily. 05/16/18   Henderson Cloud, MD    Family History Family History  Problem Relation Age of Onset  . COPD Father   . Depression Father   . Heart attack Father     Social History Social History   Tobacco Use  . Smoking status: Current  Every Day Smoker    Packs/day: 1.00    Types: Cigarettes  . Smokeless tobacco: Never Used  Substance Use Topics  . Alcohol use: Yes    Comment: daily 3-40oz daily  . Drug use: Yes    Types: Marijuana     Allergies   Hydrocodone   Review of Systems Review of Systems  All other systems reviewed and are negative.    Physical Exam Updated Vital Signs BP 93/62   Pulse 94   Temp 98.5 F (36.9 C) (Oral)   Resp (!) 24   Ht 5\' 2"  (1.575 m)   Wt 43.5 kg   SpO2 91%   BMI 17.56 kg/m   Physical Exam Vitals signs and nursing note reviewed.  Constitutional:      Appearance: She is well-developed.     Comments: dehydrated  HENT:     Head: Normocephalic and atraumatic.  Eyes:     Conjunctiva/sclera: Conjunctivae normal.  Neck:     Musculoskeletal: Neck supple.  Cardiovascular:     Rate and Rhythm: Normal rate and regular rhythm.  Pulmonary:     Effort: Pulmonary effort is normal.      Breath sounds: Normal breath sounds.  Abdominal:     General: Bowel sounds are normal.     Palpations: Abdomen is soft.     Comments: Minimal generalized tenderness  Musculoskeletal: Normal range of motion.  Skin:    General: Skin is warm and dry.  Neurological:     Mental Status: She is alert and oriented to person, place, and time.  Psychiatric:        Behavior: Behavior normal.      ED Treatments / Results  Labs (all labs ordered are listed, but only abnormal results are displayed) Labs Reviewed  CBC WITH DIFFERENTIAL/PLATELET - Abnormal; Notable for the following components:      Result Value   WBC 12.3 (*)    Hemoglobin 15.9 (*)    MCV 103.7 (*)    MCH 36.4 (*)    Neutro Abs 8.8 (*)    All other components within normal limits  COMPREHENSIVE METABOLIC PANEL - Abnormal; Notable for the following components:   Sodium 130 (*)    Potassium 2.1 (*)    Chloride 81 (*)    Glucose, Bld 104 (*)    BUN 5 (*)    AST 96 (*)    Alkaline Phosphatase 166 (*)    Total Bilirubin 2.7 (*)    Anion gap 17 (*)    All other components within normal limits  LIPASE, BLOOD    EKG EKG Interpretation  Date/Time:  Friday September 22 2018 22:26:43 EST Ventricular Rate:  91 PR Interval:    QRS Duration: 75 QT Interval:  418 QTC Calculation: 515 R Axis:   78 Text Interpretation:  Sinus rhythm Nonspecific T abnormalities, diffuse leads Prolonged QT interval Baseline wander in lead(s) V2 Confirmed by Donnetta [place]ching[place], [place] ([id number]) on 09/22/2018 10:38:58 PM   Radiology No results found.  Procedures Procedures (including critical care time)  Medications Ordered in ED Medications  potassium chloride 10 mEq in 100 mL IVPB (10 mEq Intravenous New Bag/Given 09/22/18 2235)  sodium chloride 0.9 % bolus 1,000 mL (1,000 mLs Intravenous New Bag/Given 09/22/18 2131)  ondansetron (ZOFRAN) injection 4 mg (4 mg Intravenous Given 09/22/18 2130)  sodium chloride 0.9 % bolus 1,000 mL (0 mLs Intravenous Stopped  09/22/18 2235)  LORazepam (ATIVAN) injection 0.5 mg (0.5 mg Intravenous Given 09/22/18 2130)  Initial Impression / Assessment and Plan / ED Course  I have reviewed the triage vital signs and the nursing notes.  Pertinent labs & imaging results that were available during my care of the patient were reviewed by me and considered in my medical decision making (see chart for details).        Patient presents with alcoholism, alcohol withdrawal, dehydration, failure to thrive.  Potassium 2.1.  Liver functions elevated.  Will hydrate, replace potassium, banana bag.  Admit to general medicine.   CRITICAL CARE Performed by: Donnetta Hutching Total critical care time: 30 minutes Critical care time was exclusive of separately billable procedures and treating other patients. Critical care was necessary to treat or prevent imminent or life-threatening deterioration. Critical care was time spent personally by me on the following activities: development of treatment plan with patient and/or surrogate as well as nursing, discussions with consultants, evaluation of patient's response to treatment, examination of patient, obtaining history from patient or surrogate, ordering and performing treatments and interventions, ordering and review of laboratory studies, ordering and review of radiographic studies, pulse oximetry and re-evaluation of patient's condition.  Final Clinical Impressions(s) / ED Diagnoses   Final diagnoses:  Alcohol withdrawal syndrome with complication [facility])  Hypokalemia    ED Discharge Orders    None       Donnetta Hutching, MD 09/22/18 2249

## 2018-09-22 NOTE — H&P (Signed)
History and Physical    Alison Johnson:[id number] DOB: [birth year] DOA: 09/22/2018  PCP: Vertis Kelch, NP  Patient coming from: Home  Chief Complaint: Nausea vomiting  HPI: Alison Johnson is a 33 y.o. female with medical history significant of alcohol abuse, COPD, gastritis comes in with 2 weeks of persistent nausea and vomiting with some waxing and waning epigastric abdominal pain in association with continued alcohol usage every day.  She denies any fevers.  She denies any diarrhea.  She denies any blood in her vomit.  Patient does have a history of gastritis for which she takes over-the-counter medication for.  Her last drink was this morning.  Patient has been progressively getting more weak and has been found to have a potassium level of 2.1 today.  She is referred for admission for hypokalemia and her nausea and vomiting.  Review of Systems: As per HPI otherwise 10 point review of systems negative.   Past Medical History:  Diagnosis Date  . Alcoholism (HCC)   . Cataract   . Chronic mental illness   . COPD (chronic obstructive pulmonary disease) (HCC)   . Depression   . Fractures   . Gastritis     Past Surgical History:  Procedure Laterality Date  . CATARACT EXTRACTION    . EYE SURGERY       reports that she has been smoking cigarettes. She has been smoking about 1.00 pack per day. She has never used smokeless tobacco. She reports current alcohol use. She reports current drug use. Drug: Marijuana.  Allergies  Allergen Reactions  . Hydrocodone Itching    Family History  Problem Relation Age of Onset  . COPD Father   . Depression Father   . Heart attack Father     Prior to Admission medications   Medication Sig Start Date End Date Taking? Authorizing Provider  chlordiazePOXIDE (LIBRIUM) 10 MG capsule Take 1 tablet TID for 2 days; then 1 tablet twice a day for 2 days; then 1 tablet daily for 3 days and stop librium. 11/25/17   Vassie Loll, MD  feeding supplement,  ENSURE ENLIVE, (ENSURE ENLIVE) LIQD Take 237 mLs by mouth 2 (two) times daily between meals. 11/25/17   Vassie Loll, MD  folic acid (FOLVITE) 1 MG tablet Take 1 tablet (1 mg total) by mouth daily. 05/16/18   Philip Aspen, Limmie Patricia, MD  medroxyPROGESTERone (DEPO-PROVERA) 150 MG/ML injection Inject 150 mg into the muscle every 3 (three) months.    [provider]  Multiple Vitamin (MULTIVITAMIN WITH MINERALS) TABS tablet Take 1 tablet by mouth daily. 11/26/17   Vassie Loll, MD  nicotine (NICODERM CQ - DOSED IN MG/24 HOURS) 21 mg/24hr patch Place 1 patch (21 mg total) onto the skin daily. 11/26/17   Vassie Loll, MD  pantoprazole (PROTONIX) 40 MG tablet Take 1 tablet (40 mg total) by mouth daily. 11/26/17   Vassie Loll, MD  thiamine 100 MG tablet Take 1 tablet (100 mg total) by mouth daily. 05/16/18   Henderson Cloud, MD    Physical Exam: Vitals:   09/22/18 2012 09/22/18 2015 09/22/18 2227 09/22/18 2235  BP: 94/60  120/87 93/62  Pulse: (!) 116  92 94  Resp: 20  (!) 24 (!) 24  Temp: 98.5 F (36.9 C)     TempSrc: Oral     SpO2: 100%  98% 91%  Weight:  43.5 kg    Height:  5\' 2"  (1.575 m)        Constitutional:  NAD, calm, comfortable Vitals:   09/22/18 2012 09/22/18 2015 09/22/18 2227 09/22/18 2235  BP: 94/60  120/87 93/62  Pulse: (!) 116  92 94  Resp: 20  (!) 24 (!) 24  Temp: 98.5 F (36.9 C)     TempSrc: Oral     SpO2: 100%  98% 91%  Weight:  43.5 kg    Height:  5\' 2"  (1.575 m)     Eyes: PERRL, lids and conjunctivae normal ENMT: Mucous membranes are moist. Posterior pharynx clear of any exudate or lesions.Normal dentition.  Neck: normal, supple, no masses, no thyromegaly Respiratory: clear to auscultation bilaterally, no wheezing, no crackles. Normal respiratory effort. No accessory muscle use.  Cardiovascular: Regular rate and rhythm, no murmurs / rubs / gallops. No extremity edema. 2+ pedal pulses. No carotid bruits.  Abdomen: no tenderness, no  masses palpated. No hepatosplenomegaly. Bowel sounds positive.  Musculoskeletal: no clubbing / cyanosis. No joint deformity upper and lower extremities. Good ROM, no contractures. Normal muscle tone.  Skin: no rashes, lesions, ulcers. No induration Neurologic: CN 2-12 grossly intact. Sensation intact, DTR normal. Strength 5/5 in all 4.  Psychiatric: Normal judgment and insight. Alert and oriented x 3. Normal mood.    Labs on Admission: I have personally reviewed following labs and imaging studies  CBC: Recent Labs  Lab 09/22/18 2130  WBC 12.3*  NEUTROABS 8.8*  HGB 15.9*  HCT 45.3  MCV 103.7*  PLT 334   Basic Metabolic Panel: Recent Labs  Lab 09/22/18 2130  NA 130*  K 2.1*  CL 81*  CO2 32  GLUCOSE 104*  BUN 5*  CREATININE 0.68  CALCIUM 9.2  MG 2.4   GFR: Estimated Creatinine Clearance: 68.7 mL/min (by C-G formula based on SCr of 0.68 mg/dL). Liver Function Tests: Recent Labs  Lab 09/22/18 2130  AST 96*  ALT 41  ALKPHOS 166*  BILITOT 2.7*  PROT 8.1  ALBUMIN 3.9   Recent Labs  Lab 09/22/18 2130  LIPASE 22   No results for input(s): AMMONIA in the last 168 hours. Coagulation Profile: No results for input(s): INR, PROTIME in the last 168 hours. Cardiac Enzymes: No results for input(s): CKTOTAL, CKMB, CKMBINDEX, TROPONINI in the last 168 hours. BNP (last 3 results) No results for input(s): PROBNP in the last 8760 hours. HbA1C: No results for input(s): HGBA1C in the last 72 hours. CBG: No results for input(s): GLUCAP in the last 168 hours. Lipid Profile: No results for input(s): CHOL, HDL, LDLCALC, TRIG, CHOLHDL, LDLDIRECT in the last 72 hours. Thyroid Function Tests: No results for input(s): TSH, T4TOTAL, FREET4, T3FREE, THYROIDAB in the last 72 hours. Anemia Panel: No results for input(s): VITAMINB12, FOLATE, FERRITIN, TIBC, IRON, RETICCTPCT in the last 72 hours. Urine analysis:    Component Value Date/Time   COLORURINE YELLOW 05/14/2018 2200    APPEARANCEUR CLEAR 05/14/2018 2200   LABSPEC 1.006 05/14/2018 2200   PHURINE 7.0 05/14/2018 2200   GLUCOSEU NEGATIVE 05/14/2018 2200   HGBUR NEGATIVE 05/14/2018 2200   BILIRUBINUR NEGATIVE 05/14/2018 2200   KETONESUR NEGATIVE 05/14/2018 2200   PROTEINUR NEGATIVE 05/14/2018 2200   NITRITE NEGATIVE 05/14/2018 2200   LEUKOCYTESUR TRACE (A) 05/14/2018 2200   Sepsis Labs: !!!!!!!!!!!!!!!!!!!!!!!!!!!!!!!!!!!!!!!!!!!! @LABRCNTIP (procalcitonin:4,lacticidven:4) )No results found for this or any previous visit (from the past 240 hour(s)).   Radiological Exams on Admission: No results found.  EKG: Independently reviewed.  Normal sinus rhythm diffuse T wave flattening and prolonged QTC of over 500  Patient discussed with Dr. Adriana Simas in the ED  Old chart reviewed  Assessment/Plan 33 year old alcoholic female comes in with nausea vomiting for well over a week with hypokalemia Principal Problem:   Hypokalemia-magnesium level is pending.  Getting KCl 50 mg IV overnight.  Monitor on telemetry monitoring.  Treat nausea and vomiting.  Active Problems:   Nausea and vomiting-give GI cocktail.  Placed on Protonix.  No evidence of bleeding.  Abdominal exam is benign.  LFTs chronically bumped lipase normal.    Alcohol abuse-placed on CIWA protocol.  No evidence of withdrawing at this time she feels better with IV fluid she is received in the ED.    Pt seen before midnight   DVT prophylaxis: SCDs Code Status: Full Family Communication: None Disposition Plan: 1 to 3 days Consults called: None Admission status: Admission   Annlee Glandon A MD [facility]  If 7PM-7AM, please contact night-coverage [email or web address] Password [email or web address]  09/22/2018, 11:13 PM

## 2018-09-22 NOTE — ED Triage Notes (Signed)
Pt c/o vomiting x 3 weeks with abdominal pain; pt states she drinks alcohol daily and has not had anything to drink in the last 3 days; pt states she thinks she is going through withdrawals

## 2018-09-23 ENCOUNTER — Other Ambulatory Visit: Payer: Self-pay

## 2018-09-23 LAB — URINALYSIS, ROUTINE W REFLEX MICROSCOPIC
Bilirubin Urine: NEGATIVE
Glucose, UA: NEGATIVE mg/dL
Hgb urine dipstick: NEGATIVE
KETONES UR: NEGATIVE mg/dL
Nitrite: NEGATIVE
Protein, ur: NEGATIVE mg/dL
Specific Gravity, Urine: 1.004 — ABNORMAL LOW (ref 1.005–1.030)
pH: 6 (ref 5.0–8.0)

## 2018-09-23 LAB — HCG, QUANTITATIVE, PREGNANCY: hCG, Beta Chain, Quant, S: <1 m[IU]/mL (ref <5)

## 2018-09-23 LAB — RAPID URINE DRUG SCREEN, HOSP PERFORMED
Amphetamines: NOT DETECTED
Barbiturates: NOT DETECTED
Benzodiazepines: POSITIVE — AB
Cocaine: NOT DETECTED
Opiates: NOT DETECTED
TETRAHYDROCANNABINOL: POSITIVE — AB

## 2018-09-23 LAB — PREGNANCY, URINE: Preg Test, Ur: NEGATIVE

## 2018-09-23 MED ORDER — SODIUM CHLORIDE 0.9 % IV SOLN
INTRAVENOUS | Status: DC
  Administered 2018-09-23 – 2018-09-24 (×2): via INTRAVENOUS

## 2018-09-23 MED ORDER — LIDOCAINE VISCOUS HCL 2 % MT SOLN
15.0000 mL | Freq: Once | OROMUCOSAL | Status: AC
  Administered 2018-09-23: 15 mL via ORAL
  Filled 2018-09-23: qty 15

## 2018-09-23 MED ORDER — ENSURE ENLIVE PO LIQD
237.0000 mL | Freq: Two times a day (BID) | ORAL | Status: DC
  Administered 2018-09-23: 237 mL via ORAL

## 2018-09-23 MED ORDER — ALUM & MAG HYDROXIDE-SIMETH 200-200-20 MG/5ML PO SUSP
30.0000 mL | Freq: Once | ORAL | Status: AC
  Administered 2018-09-23: 30 mL via ORAL
  Filled 2018-09-23: qty 30

## 2018-09-23 MED ORDER — PANTOPRAZOLE SODIUM 40 MG IV SOLR
40.0000 mg | INTRAVENOUS | Status: DC
  Administered 2018-09-23 – 2018-09-25 (×3): 40 mg via INTRAVENOUS
  Filled 2018-09-23 (×3): qty 40

## 2018-09-23 MED ORDER — NICOTINE 14 MG/24HR TD PT24
14.0000 mg | MEDICATED_PATCH | Freq: Every day | TRANSDERMAL | Status: DC
  Administered 2018-09-23 – 2018-09-25 (×3): 14 mg via TRANSDERMAL
  Filled 2018-09-23 (×3): qty 1

## 2018-09-23 MED ORDER — SODIUM CHLORIDE 0.9 % IV BOLUS
1000.0000 mL | Freq: Once | INTRAVENOUS | Status: AC
  Administered 2018-09-23: 1000 mL via INTRAVENOUS

## 2018-09-23 MED ORDER — ENOXAPARIN SODIUM 30 MG/0.3ML ~~LOC~~ SOLN
30.0000 mg | SUBCUTANEOUS | Status: DC
  Administered 2018-09-23: 30 mg via SUBCUTANEOUS
  Filled 2018-09-23 (×2): qty 0.3

## 2018-09-23 MED ORDER — ENSURE ENLIVE PO LIQD: 237.0000 mL | Freq: Two times a day (BID) | ORAL | Status: DC

## 2018-09-23 NOTE — Progress Notes (Signed)
PROGRESS NOTE  MAYRIM CRAYNE MSX:[id number] DOB: 12-Sep-1985 DOA: 09/22/2018 PCP: Vertis Kelch, NP  HPI/Recap of past 24 hours: Alison Johnson is a 33 y.o. female with medical history significant of alcohol abuse, COPD, gastritis comes in with 2 weeks of persistent nausea and vomiting with some waxing and waning epigastric abdominal pain in association with continued alcohol usage every day.  She denies any fevers.  She denies any diarrhea.  She denies any blood in her vomit.  Patient does have a history of gastritis for which she takes over-the-counter medication for.  Her last drink was this morning.  Patient has been progressively getting more weak and has been found to have a potassium level of 2.1 today.  She is referred for admission for hypokalemia and her nausea and vomiting.  09/23/18: Seen and examined at her bedside. Persistent diarrhea. Watery yellow stool. GI panel pcr ordered and pending. Nausea is improving with IV zofran.  Assessment/Plan: Principal Problem:   Hypokalemia Active Problems:   Nausea and vomiting   Alcohol abuse  Intractable nausea and vomiting, unclear etiology Lipase level normal GI panel PCR pending Continue IV Zofran Continue to monitor output Diet as tolerated  Hypokalemia suspect secondary to GI losses from diarrhea/vomiting Presented with potassium 2.1 Repleted with oral potassium and magnesium Repeat BMP in the morning  Leukocytosis Presented with WBC 12 point 3K Possibly reactive No clear sign of active infective process Urine negative No chest x-ray No upper respiratory symptoms  Alcohol abuse with concern for withdrawal Continue CIWA protocol  GERD Continue Protonix  DVT prophylaxis:  Subcu Lovenox daily Code Status: Full Family Communication: None Disposition Plan: 1 to 3 days Consults called: None    Objective: Vitals:   09/22/18 2329 09/23/18 0612 09/23/18 0613 09/23/18 1235  BP: 92/62 (!) 79/51 (!) 86/54 (!) 89/62    Pulse: 93 89 91 89  Resp: 16 16  18   Temp: 98.6 F (37 C) 98.4 F (36.9 C)  98.7 F (37.1 C)  TempSrc: Oral Oral  Oral  SpO2: 100% 97% 98% 94%  Weight:      Height:        Intake/Output Summary (Last 24 hours) at 09/23/2018 1548 Last data filed at 09/23/2018 1521 Gross per 24 hour  Intake 1611.32 ml  Output -  Net 1611.32 ml   Filed Weights   09/22/18 2015  Weight: 43.5 kg    Exam:  . General: 33 y.o. year-old female well developed well nourished in no acute distress.  Alert and oriented x3. . Cardiovascular: Regular rate and rhythm with no rubs or gallops.  No thyromegaly or JVD noted.   Marland Kitchen Respiratory: Clear to auscultation with no wheezes or rales. Good inspiratory effort. . Abdomen: Soft nontender nondistended with normal bowel sounds x4 quadrants. . Musculoskeletal: No lower extremity edema. 2/4 pulses in all 4 extremities. . Skin: No ulcerative lesions noted or rashes, . Psychiatry: Mood is appropriate for condition and setting   Data Reviewed: CBC: Recent Labs  Lab 09/22/18 2130  WBC 12.3*  NEUTROABS 8.8*  HGB 15.9*  HCT 45.3  MCV 103.7*  PLT 334   Basic Metabolic Panel: Recent Labs  Lab 09/22/18 2130  NA 130*  K 2.1*  CL 81*  CO2 32  GLUCOSE 104*  BUN 5*  CREATININE 0.68  CALCIUM 9.2  MG 2.4   GFR: Estimated Creatinine Clearance: 68.7 mL/min (by C-G formula based on SCr of 0.68 mg/dL). Liver Function Tests: Recent Labs  Lab 09/22/18  2130  AST 96*  ALT 41  ALKPHOS 166*  BILITOT 2.7*  PROT 8.1  ALBUMIN 3.9   Recent Labs  Lab 09/22/18 2130  LIPASE 22   No results for input(s): AMMONIA in the last 168 hours. Coagulation Profile: No results for input(s): INR, PROTIME in the last 168 hours. Cardiac Enzymes: No results for input(s): CKTOTAL, CKMB, CKMBINDEX, TROPONINI in the last 168 hours. BNP (last 3 results) No results for input(s): PROBNP in the last 8760 hours. HbA1C: No results for input(s): HGBA1C in the last 72  hours. CBG: No results for input(s): GLUCAP in the last 168 hours. Lipid Profile: No results for input(s): CHOL, HDL, LDLCALC, TRIG, CHOLHDL, LDLDIRECT in the last 72 hours. Thyroid Function Tests: No results for input(s): TSH, T4TOTAL, FREET4, T3FREE, THYROIDAB in the last 72 hours. Anemia Panel: No results for input(s): VITAMINB12, FOLATE, FERRITIN, TIBC, IRON, RETICCTPCT in the last 72 hours. Urine analysis:    Component Value Date/Time   COLORURINE YELLOW 09/23/2018 1045   APPEARANCEUR CLEAR 09/23/2018 1045   LABSPEC 1.004 (L) 09/23/2018 1045   PHURINE 6.0 09/23/2018 1045   GLUCOSEU NEGATIVE 09/23/2018 1045   HGBUR NEGATIVE 09/23/2018 1045   BILIRUBINUR NEGATIVE 09/23/2018 1045   KETONESUR NEGATIVE 09/23/2018 1045   PROTEINUR NEGATIVE 09/23/2018 1045   NITRITE NEGATIVE 09/23/2018 1045   LEUKOCYTESUR SMALL (A) 09/23/2018 1045   Sepsis Labs: @LABRCNTIP (procalcitonin:4,lacticidven:4)  )No results found for this or any previous visit (from the past 240 hour(s)).    Studies: No results found.  Scheduled Meds: . folic acid  1 mg Oral Daily  . multivitamin with minerals  1 tablet Oral Daily  . nicotine  14 mg Transdermal Daily  . pantoprazole (PROTONIX) IV  40 mg Intravenous Q24H  . thiamine  100 mg Oral Daily   Or  . thiamine  100 mg Intravenous Daily    Continuous Infusions: . sodium chloride 100 mL/hr at 09/23/18 0752     LOS: 1 day     Darlin Drop, MD [facility] Pager [phone]  If 7PM-7AM, please contact night-coverage [email or web address] Password [facility] 09/23/2018, 3:48 PM

## 2018-09-23 NOTE — Progress Notes (Signed)
Initial Nutrition Assessment  DOCUMENTATION CODES:  Underweight  INTERVENTION:  Ensure Enlive po BID, each supplement provides 350 kcal and 20 grams of protein  MVI with minerals  Would consider CSW consult for resources regarding etoh abuse (if pt interested in quitting)  NUTRITION DIAGNOSIS:  Underweight related to chronic illness(Alcoholism) as evidenced by BMI <18.5.  GOAL:  Patient will meet greater than or equal to 90% of their needs  MONITOR:  PO intake, Supplement acceptance, I & O's, Labs, Diet advancement, Weight trends  REASON FOR ASSESSMENT:  Malnutrition Screening Tool    ASSESSMENT:  33 y/o female PMHx etoh/tob abuse, COPD, Gastritis, depression. Presented w/ 2 weeks of nausea, vomiting and waxing and waning abdominal pain. She has had progressively weaker. She continues to drink alcohol every day. In ED, found to be hypokalemic. Admitted for management.   RD operating remotely on weekends. Pt being assessed remotely d/t MST of 3- pt reported unintentional wt loss and decreased intake because of a poor appetite.   Per chart, pt is an active alcholic-apparently drinking up until the morning she was admitted to the hospital. ED provider notes reports the patient has been drinking 4 "extra large" bottles of beer each day (40oz?).   Per chart, pt was admitted at weight of 96 lb. Despite her alcoholism, Her weight appears to have been stable over the past year, fluctuating between 90 and 99 lbs. That said, her BMI is underweight.   At this time, pt seems to be eating fairly well. Has eaten 75% of her Lunch today. Given her likely poor nutritional habits at baseline, will order oral protein supplements in addition to a mvi.   Labs: k:2.1, Na: 130, wbc:12.3, Elevated LFTs/bili Meds: Folate, thiamin, MVI with minerals, IV kcl, IVF, PRN ativan  Recent Labs  Lab 09/22/18 2130  NA 130*  K 2.1*  CL 81*  CO2 32  BUN 5*  CREATININE 0.68  CALCIUM 9.2  MG 2.4  GLUCOSE  104*   NUTRITION - FOCUSED PHYSICAL EXAM: Unable to conduct   Diet Order:   Diet Order            DIET SOFT Room service appropriate? Yes; Fluid consistency: Thin  Diet effective now             EDUCATION NEEDS:  Not appropriate for education at this time  Skin:  Skin Assessment: Reviewed RN Assessment  Last BM:  2/27  Height:  Ht Readings from Last 1 Encounters:  09/22/18 5\' 2"  (1.575 m)   Weight:  Wt Readings from Last 1 Encounters:  09/22/18 43.5 kg   Wt Readings from Last 10 Encounters:  09/22/18 43.5 kg  07/05/18 46.3 kg  05/14/18 43 kg  11/23/17 40.8 kg  11/21/17 40.8 kg  11/09/17 41.7 kg  09/27/17 44.5 kg  09/14/17 44.5 kg  09/07/17 42.2 kg  09/04/17 49.9 kg   Ideal Body Weight:  50 kg  BMI:  Body mass index is 17.56 kg/m.  Estimated Nutritional Needs:  Kcal:  1500-1750 kcals (35-40 kcal/kg bw) Protein:  75-87g Pro (1.7-2g/kg bw) Fluid:  >1.5 L (35 ml/kg bw)  Christophe Louis RD, LDN, CNSC Clinical Nutrition Available Tues-Sat via Pager: [phone] 09/23/2018 6:35 PM

## 2018-09-24 LAB — BASIC METABOLIC PANEL
Anion gap: 5 (ref 5–15)
BUN: <5 mg/dL — ABNORMAL LOW (ref 6–20)
CO2: 27 mmol/L (ref 22–32)
CREATININE: 0.48 mg/dL (ref 0.44–1.00)
Calcium: 7.9 mg/dL — ABNORMAL LOW (ref 8.9–10.3)
Chloride: 107 mmol/L (ref 98–111)
GFR calc Af Amer: >60 mL/min (ref >60)
GFR calc non Af Amer: >60 mL/min (ref >60)
Glucose, Bld: 95 mg/dL (ref 70–99)
Potassium: 2.9 mmol/L — ABNORMAL LOW (ref 3.5–5.1)
Sodium: 139 mmol/L (ref 135–145)

## 2018-09-24 LAB — MAGNESIUM: Magnesium: 1.9 mg/dL (ref 1.7–2.4)

## 2018-09-24 LAB — CBC WITH DIFFERENTIAL/PLATELET
ABS IMMATURE GRANULOCYTES: 0.01 10*3/uL (ref 0.00–0.07)
Basophils Absolute: 0.1 10*3/uL (ref 0.0–0.1)
Basophils Relative: 1 %
Eosinophils Absolute: 0.3 10*3/uL (ref 0.0–0.5)
Eosinophils Relative: 6 %
HCT: 30 % — ABNORMAL LOW (ref 36.0–46.0)
Hemoglobin: 9.8 g/dL — ABNORMAL LOW (ref 12.0–15.0)
Immature Granulocytes: 0 %
LYMPHS ABS: 1.3 10*3/uL (ref 0.7–4.0)
LYMPHS PCT: 24 %
MCH: 36 pg — AB (ref 26.0–34.0)
MCHC: 32.7 g/dL (ref 30.0–36.0)
MCV: 110.3 fL — ABNORMAL HIGH (ref 80.0–100.0)
Monocytes Absolute: 0.4 10*3/uL (ref 0.1–1.0)
Monocytes Relative: 8 %
Neutro Abs: 3.4 10*3/uL (ref 1.7–7.7)
Neutrophils Relative %: 61 %
Platelets: 251 10*3/uL (ref 150–400)
RBC: 2.72 MIL/uL — ABNORMAL LOW (ref 3.87–5.11)
RDW: 14.5 % (ref 11.5–15.5)
WBC: 5.5 10*3/uL (ref 4.0–10.5)
nRBC: 0 % (ref 0.0–0.2)

## 2018-09-24 MED ORDER — POTASSIUM CHLORIDE CRYS ER 20 MEQ PO TBCR
40.0000 meq | EXTENDED_RELEASE_TABLET | Freq: Three times a day (TID) | ORAL | Status: AC
  Administered 2018-09-24 – 2018-09-25 (×3): 40 meq via ORAL
  Filled 2018-09-24 (×3): qty 2

## 2018-09-24 NOTE — Progress Notes (Signed)
PROGRESS NOTE  Alison Johnson UJW:[id number] DOB: 19-Apr-1986 DOA: 09/22/2018 PCP: Vertis Kelch, NP  HPI/Recap of past 24 hours: Alison Johnson is a 33 y.o. female with medical history significant of alcohol abuse, COPD, gastritis comes in with 2 weeks of persistent nausea and vomiting with some waxing and waning epigastric abdominal pain in association with continued alcohol usage every day.  She denies any fevers.  She denies any diarrhea.  She denies any blood in her vomit.  Patient does have a history of gastritis for which she takes over-the-counter medication for.  Her last drink was this morning.  Patient has been progressively getting more weak and has been found to have a potassium level of 2.1 today.  She is referred for admission for hypokalemia and her nausea and vomiting.  09/24/18: Seen and examined at her bedside.  No acute events overnight.  She is concerned about with alcohol withdrawal.  Would like to receive help with alcohol detoxification.  Will consult case manager to assist.  States she is starting to feel anxious and restless.  Assessment/Plan: Principal Problem:   Hypokalemia Active Problems:   Nausea and vomiting   Alcohol abuse  Resolved intractable nausea and vomiting Suspect viral gastroenteritis Lipase level normal GI panel PCR unable to collect stool due to resolution of diarrhea As needed IV Zofran  Persistent hypokalemia  Presented with potassium 2.1 Improved to 2.9 Repleted with p.o. KCl 40 mEq x 3 doses Repeat BMP in the morning  Alcohol abuse with concern for withdrawal Continue CIWA protocol Continue thiamine folate and thiamine Case manager to assist with enrollment to outpatient alcohol detox program  Acute drop in hemoglobin Hemoglobin on presentation 15.9 Hemoglobin 1 three 129.8 Repeat CBC in the morning No sign of overt bleeding Could be dilutional  Resolved leukocytosis Presented with WBC 12.3K Possibly reactive No clear sign of  active infective process Urine analysis negative No chest x-ray No upper respiratory symptoms  GERD Continue Protonix  DVT prophylaxis:  Subcu Lovenox daily Code Status: Full Family Communication: None Disposition Plan: 1 to 2 days Consults called: None    Objective: Vitals:   09/23/18 1930 09/24/18 0130 09/24/18 0500 09/24/18 0700  BP: (!) 82/53 (!) 82/50  91/61  Pulse: 95 79  88  Resp: 18 16  20   Temp: 98.4 F (36.9 C) 98.4 F (36.9 C)    TempSrc: Oral Oral    SpO2: 96% 96%  98%  Weight:   48.2 kg   Height:        Intake/Output Summary (Last 24 hours) at 09/24/2018 1317 Last data filed at 09/24/2018 1300 Gross per 24 hour  Intake 2569.16 ml  Output -  Net 2569.16 ml   Filed Weights   09/22/18 2015 09/24/18 0500  Weight: 43.5 kg 48.2 kg    Exam:  . General: 33 y.o. year-old female well developed well-nourished.  Mild restlessness noted. . Cardiovascular: Regular rate and rhythm with no rubs or gallops.  No JVD or thyromegaly. Marland Kitchen Respiratory: Clear to auscultation with no wheezes or rales.  Good inspiratory effort. . Abdomen: Soft nontender nondistended with normal bowel sounds x4 quadrants. . Musculoskeletal: No lower extremity edema. 2/4 pulses in all 4 extremities. . Skin: No ulcerative lesions noted or rashes, . Psychiatry: Mood is anxious  Data Reviewed: CBC: Recent Labs  Lab 09/22/18 2130 09/24/18 0828  WBC 12.3* 5.5  NEUTROABS 8.8* 3.4  HGB 15.9* 9.8*  HCT 45.3 30.0*  MCV 103.7* 110.3*  PLT 334  251   Basic Metabolic Panel: Recent Labs  Lab 09/22/18 2130 09/24/18 0715  NA 130* 139  K 2.1* 2.9*  CL 81* 107  CO2 32 27  GLUCOSE 104* 95  BUN 5* <5*  CREATININE 0.68 0.48  CALCIUM 9.2 7.9*  MG 2.4 1.9   GFR: Estimated Creatinine Clearance: 76.1 mL/min (by C-G formula based on SCr of 0.48 mg/dL). Liver Function Tests: Recent Labs  Lab 09/22/18 2130  AST 96*  ALT 41  ALKPHOS 166*  BILITOT 2.7*  PROT 8.1  ALBUMIN 3.9   Recent Labs    Lab 09/22/18 2130  LIPASE 22   No results for input(s): AMMONIA in the last 168 hours. Coagulation Profile: No results for input(s): INR, PROTIME in the last 168 hours. Cardiac Enzymes: No results for input(s): CKTOTAL, CKMB, CKMBINDEX, TROPONINI in the last 168 hours. BNP (last 3 results) No results for input(s): PROBNP in the last 8760 hours. HbA1C: No results for input(s): HGBA1C in the last 72 hours. CBG: No results for input(s): GLUCAP in the last 168 hours. Lipid Profile: No results for input(s): CHOL, HDL, LDLCALC, TRIG, CHOLHDL, LDLDIRECT in the last 72 hours. Thyroid Function Tests: No results for input(s): TSH, T4TOTAL, FREET4, T3FREE, THYROIDAB in the last 72 hours. Anemia Panel: No results for input(s): VITAMINB12, FOLATE, FERRITIN, TIBC, IRON, RETICCTPCT in the last 72 hours. Urine analysis:    Component Value Date/Time   COLORURINE YELLOW 09/23/2018 1045   APPEARANCEUR CLEAR 09/23/2018 1045   LABSPEC 1.004 (L) 09/23/2018 1045   PHURINE 6.0 09/23/2018 1045   GLUCOSEU NEGATIVE 09/23/2018 1045   HGBUR NEGATIVE 09/23/2018 1045   BILIRUBINUR NEGATIVE 09/23/2018 1045   KETONESUR NEGATIVE 09/23/2018 1045   PROTEINUR NEGATIVE 09/23/2018 1045   NITRITE NEGATIVE 09/23/2018 1045   LEUKOCYTESUR SMALL (A) 09/23/2018 1045   Sepsis Labs: @LABRCNTIP (procalcitonin:4,lacticidven:4)  )No results found for this or any previous visit (from the past 240 hour(s)).    Studies: No results found.  Scheduled Meds: . enoxaparin (LOVENOX) injection  30 mg Subcutaneous Q24H  . feeding supplement (ENSURE ENLIVE)  237 mL Oral BID BM  . folic acid  1 mg Oral Daily  . multivitamin with minerals  1 tablet Oral Daily  . nicotine  14 mg Transdermal Daily  . pantoprazole (PROTONIX) IV  40 mg Intravenous Q24H  . potassium chloride  40 mEq Oral TID  . thiamine  100 mg Oral Daily   Or  . thiamine  100 mg Intravenous Daily    Continuous Infusions: . sodium chloride 75 mL/hr at  09/23/18 1737     LOS: 2 days     Darlin Drop, MD [facility] Pager [phone]  If 7PM-7AM, please contact night-coverage [email or web address] Password [email or web address] 09/24/2018, 1:17 PM

## 2018-09-25 ENCOUNTER — Encounter (HOSPITAL_COMMUNITY): Payer: Self-pay | Admitting: Licensed Clinical Social Worker

## 2018-09-25 DIAGNOSIS — F1010 Alcohol abuse, uncomplicated: Secondary | ICD-10-CM

## 2018-09-25 DIAGNOSIS — R112 Nausea with vomiting, unspecified: Secondary | ICD-10-CM

## 2018-09-25 LAB — BASIC METABOLIC PANEL
Anion gap: 4 — ABNORMAL LOW (ref 5–15)
BUN: <5 mg/dL — ABNORMAL LOW (ref 6–20)
CO2: 24 mmol/L (ref 22–32)
Calcium: 7.9 mg/dL — ABNORMAL LOW (ref 8.9–10.3)
Chloride: 111 mmol/L (ref 98–111)
Creatinine, Ser: 0.46 mg/dL (ref 0.44–1.00)
GFR calc Af Amer: >60 mL/min (ref >60)
GFR calc non Af Amer: >60 mL/min (ref >60)
Glucose, Bld: 88 mg/dL (ref 70–99)
Potassium: 4.1 mmol/L (ref 3.5–5.1)
Sodium: 139 mmol/L (ref 135–145)

## 2018-09-25 LAB — CBC
HCT: 32.6 % — ABNORMAL LOW (ref 36.0–46.0)
Hemoglobin: 10.3 g/dL — ABNORMAL LOW (ref 12.0–15.0)
MCH: 36.5 pg — ABNORMAL HIGH (ref 26.0–34.0)
MCHC: 31.6 g/dL (ref 30.0–36.0)
MCV: 115.6 fL — AB (ref 80.0–100.0)
Platelets: 282 10*3/uL (ref 150–400)
RBC: 2.82 MIL/uL — AB (ref 3.87–5.11)
RDW: 14.7 % (ref 11.5–15.5)
WBC: 5.8 10*3/uL (ref 4.0–10.5)
nRBC: 0 % (ref 0.0–0.2)

## 2018-09-25 LAB — MAGNESIUM: Magnesium: 1.7 mg/dL (ref 1.7–2.4)

## 2018-09-25 MED ORDER — POTASSIUM CHLORIDE IN NACL 20-0.9 MEQ/L-% IV SOLN
INTRAVENOUS | Status: DC
  Administered 2018-09-25: 08:00:00 via INTRAVENOUS

## 2018-09-25 MED ORDER — FOLIC ACID 1 MG PO TABS: 1.0000 mg | ORAL_TABLET | Freq: Every day | ORAL | Status: AC

## 2018-09-25 MED ORDER — ADULT MULTIVITAMIN W/MINERALS CH: 1.0000 | ORAL_TABLET | Freq: Every day | ORAL | Status: AC

## 2018-09-25 MED ORDER — POLYETHYLENE GLYCOL 3350 17 G PO PACK
17.0000 g | PACK | Freq: Every day | ORAL | Status: DC
  Administered 2018-09-25: 17 g via ORAL
  Filled 2018-09-25: qty 1

## 2018-09-25 MED ORDER — POTASSIUM CHLORIDE CRYS ER 20 MEQ PO TBCR
60.0000 meq | EXTENDED_RELEASE_TABLET | Freq: Once | ORAL | Status: DC
  Filled 2018-09-25: qty 3

## 2018-09-25 MED ORDER — THIAMINE HCL 100 MG PO TABS: 100.0000 mg | ORAL_TABLET | Freq: Every day | ORAL | Status: AC

## 2018-09-25 NOTE — Clinical Social Work Note (Signed)
Clinical Social Work Assessment  Patient Details  Name: Alison Johnson MRN: [id number] Date of Birth: [birth year]  Date of referral:  09/25/18               Reason for consult:  Substance Use/ETOH Abuse                Permission sought to share information with:    Permission granted to share information::     Name::        Agency::     Relationship::     Contact Information:     Housing/Transportation Living arrangements for the past 2 months:  Single Family Home Source of Information:  Patient Patient Interpreter Needed:  None Criminal Activity/Legal Involvement Pertinent to Current Situation/Hospitalization:  No - Comment as needed Significant Relationships:  Dependent Children, Parents, Significant Other Lives with:  Significant Other, Minor Children Do you feel safe going back to the place where you live?  Yes Need for family participation in patient care:  Yes (Comment)  Care giving concerns:  No care giving concerns identified.    Social Worker assessment / plan:  [facility] past, several months ago. She is willing to reengage with [facility] for counseling, SA  Treatment and possibly medication management. Patient has an appointment at [facility] on 09/28/2018 at 10:30.  Patient provided verbal permission for her discharge summary to be faxed to [facility] at [phone].   LCSW signing off.    Employment status:  Unemployed Health and safety inspector:  Medicaid [place] PT Recommendations:    Information / Referral to community resources:  Other (Comment Required)([facility] [place])  Patient/Family's Response to care:  Patient desires treatment as she wants to stop drinking alcohol.   Patient/Family's Understanding of and Emotional Response to Diagnosis, Current Treatment, and Prognosis:  Patient understands her diagnosis, treatment and prognosis and feels that ongoing medication management and SA treatment is needed.   Emotional Assessment Appearance:   Appears stated age Attitude/Demeanor/Rapport:    Affect (typically observed):  Accepting, Calm Orientation:  Oriented to Place, Oriented to Self, Oriented to  Time, Oriented to Situation Alcohol / Substance use:  Alcohol Use, Tobacco Use Psych involvement (Current and /or in the community):  No (Comment)  Discharge Needs  Concerns to be addressed:  Discharge Planning Concerns Readmission within the last 30 days:  No Current discharge risk:  None Barriers to Discharge:  No Barriers Identified   Annice Needy, LCSW 09/25/2018, 11:47 AM

## 2018-09-25 NOTE — Discharge Summary (Signed)
Physician Discharge Summary  Alison Johnson HMC:[id number] DOB: 1985/08/07 DOA: 09/22/2018  PCP: Dionisio Paschal, NP  Admit date: 09/22/2018 Discharge date: 09/25/2018  Admitted From: Home  Disposition: Home   Recommendations for Outpatient Follow-up:  1. Follow up with PCP in 2 days 2. Follow up with [facility] in 3 days as scheduled for addiction treatment  Discharge Condition: STABLE   CODE STATUS: FULL    Brief Hospitalization Summary: Please see all hospital notes, images, labs for full details of the hospitalization. Dr. Camelia Eng HPI: Alison Johnson is a 33 y.o. female with medical history significant of alcohol abuse, COPD, gastritis comes in with 2 weeks of persistent nausea and vomiting with some waxing and waning epigastric abdominal pain in association with continued alcohol usage every day.  She denies any fevers.  She denies any diarrhea.  She denies any blood in her vomit.  Patient does have a history of gastritis for which she takes over-the-counter medication for.  Her last drink was this morning.  Patient has been progressively getting more weak and has been found to have a potassium level of 2.1 today.  She is referred for admission for hypokalemia and her nausea and vomiting.  The patient was admitted for concerns of a viral gastroenteritis associated with intractable nausea and vomiting.  She was treated with supportive therapy with IV Zofran and fluids.  This improved her symptoms and her diarrhea resolved.  The patient's electrolytes were corrected.  She received potassium replacement.  She was also placed on a CIWA protocol for alcohol withdrawal.  She did not go into acute alcohol withdrawal in the hospital.  Social worker was consulted and made arrangements for her to go to [facility] in 3 days for treatment of substance abuse issues.  The patient has agreed to seek treatment.  The patient was noted to have an acute drop in hemoglobin likely related to hemodilution from fluid  hydration.  She has been started on vitamin supplements to support deficiencies associated with substance abuse and alcoholism.  Her leukocytosis has resolved completely.  No signs of infection found.  Patient was advised to follow-up with her primary care provider in 2 days and to follow-up with [facility] as scheduled in 3 days.  The patient verbalized understanding.  Substance abuse treatment information provided to the patient.  Discharge Diagnoses:  Principal Problem:   Hypokalemia Active Problems:   Nausea and vomiting   Alcohol abuse   Discharge Instructions: Discharge Instructions    Call MD for:  difficulty breathing, headache or visual disturbances   Complete by:  As directed    Call MD for:  extreme fatigue   Complete by:  As directed    Call MD for:  persistant dizziness or light-headedness   Complete by:  As directed    Diet - low sodium heart healthy   Complete by:  As directed    Increase activity slowly   Complete by:  As directed      Allergies as of 09/25/2018      Reactions   Hydrocodone Itching      Medication List    TAKE these medications   folic acid 1 MG tablet Commonly known as:  FOLVITE Take 1 tablet (1 mg total) by mouth daily. Start taking on:  September 26, 2018   medroxyPROGESTERone 150 MG/ML injection Commonly known as:  DEPO-PROVERA Inject 150 mg into the muscle every 3 (three) months.   multivitamin with minerals Tabs tablet Take 1 tablet by mouth daily.  Start taking on:  September 26, 2018   pantoprazole 40 MG tablet Commonly known as:  PROTONIX Take 1 tablet (40 mg total) by mouth daily.   thiamine 100 MG tablet Take 1 tablet (100 mg total) by mouth daily. Start taking on:  September 26, 2018      Follow-up Information    Services, [facility]. Go in 3 day(s).   Why:  Go to [facility] on Thursday 09/28/2018 at 10:30 a.m. Contact information: [address] [place] [address] [phone]        Dionisio Paschal, NP. Schedule an appointment as  soon as possible for a visit in 2 day(s).   Specialty:  Nurse Practitioner Why:  Hospital Follow Up  Contact information: [address] [place] [address] [phone]          Allergies  Allergen Reactions  . Hydrocodone Itching   Allergies as of 09/25/2018      Reactions   Hydrocodone Itching      Medication List    TAKE these medications   folic acid 1 MG tablet Commonly known as:  FOLVITE Take 1 tablet (1 mg total) by mouth daily. Start taking on:  September 26, 2018   medroxyPROGESTERone 150 MG/ML injection Commonly known as:  DEPO-PROVERA Inject 150 mg into the muscle every 3 (three) months.   multivitamin with minerals Tabs tablet Take 1 tablet by mouth daily. Start taking on:  September 26, 2018   pantoprazole 40 MG tablet Commonly known as:  PROTONIX Take 1 tablet (40 mg total) by mouth daily.   thiamine 100 MG tablet Take 1 tablet (100 mg total) by mouth daily. Start taking on:  September 26, 2018       Procedures/Studies:  No results found.   Subjective: The patient is tolerating diet.  She is having no nausea vomiting diarrhea or abdominal pain.  She would like to discharge home.  She met with the social worker and has agreed to go to [facility].  Discharge Exam: Vitals:   09/25/18 0007 09/25/18 0600  BP: (!) 81/48 (!) 95/52  Pulse: 83 85  Resp: 17 18  Temp: 98.9 F (37.2 C) 98.8 F (37.1 C)  SpO2: 97% 98%   Vitals:   09/25/18 0007 09/25/18 0007 09/25/18 0600 09/25/18 0700  BP: (!) 81/48 (!) 81/48 (!) 95/52   Pulse: 83 83 85   Resp: _0 Temp: 98.9 F (37.2 C) 98.9 F (37.2 C) 98.8 F (37.1 C)   TempSrc: Oral Oral Oral   SpO2: 98% 97% 98%   Weight:    49.6 kg  Height:        General: Pt is alert, awake, not in acute distress Cardiovascular: RRR, S1/S2 +, no rubs, no gallops Respiratory: CTA bilaterally, no wheezing, no rhonchi Abdominal: Soft, NT, ND, bowel sounds + Extremities: no edema, no  cyanosis   The results of significant diagnostics from this hospitalization (including imaging, microbiology, ancillary and laboratory) are listed below for reference.     Microbiology: No results found for this or any previous visit (from the past 240 hour(s)).   Labs: BNP (last 3 results) No results for input(s): BNP in the last 8760 hours. Basic Metabolic Panel: Recent Labs  Lab 09/22/18 2130 09/24/18 0715 09/25/18 0609  NA 130* 139 139  K 2.1* 2.9* 4.1  CL 81* 107 111  CO2 32 27 24  GLUCOSE 104* 95 88  BUN 5* <5* <5*  CREATININE 0.68 0.48 0.46  CALCIUM 9.2 7.9* 7.9*  MG 2.4 1.9 1.7   Liver Function Tests: Recent Labs  Lab 09/22/18 2130  AST 96*  ALT 41  ALKPHOS 166*  BILITOT 2.7*  PROT 8.1  ALBUMIN 3.9   Recent Labs  Lab 09/22/18 2130  LIPASE 22   No results for input(s): AMMONIA in the last 168 hours. CBC: Recent Labs  Lab 09/22/18 2130 09/24/18 0828 09/25/18 0609  WBC 12.3* 5.5 5.8  NEUTROABS 8.8* 3.4  --   HGB 15.9* 9.8* 10.3*  HCT 45.3 30.0* 32.6*  MCV 103.7* 110.3* 115.6*  PLT 334 251 282   Cardiac Enzymes: No results for input(s): CKTOTAL, CKMB, CKMBINDEX, TROPONINI in the last 168 hours. BNP: Invalid input(s): POCBNP CBG: No results for input(s): GLUCAP in the last 168 hours. D-Dimer No results for input(s): DDIMER in the last 72 hours. Hgb A1c No results for input(s): HGBA1C in the last 72 hours. Lipid Profile No results for input(s): CHOL, HDL, LDLCALC, TRIG, CHOLHDL, LDLDIRECT in the last 72 hours. Thyroid function studies No results for input(s): TSH, T4TOTAL, T3FREE, THYROIDAB in the last 72 hours.  Invalid input(s): FREET3 Anemia work up No results for input(s): VITAMINB12, FOLATE, FERRITIN, TIBC, IRON, RETICCTPCT in the last 72 hours. Urinalysis    Component Value Date/Time   COLORURINE YELLOW 09/23/2018 1045   APPEARANCEUR CLEAR 09/23/2018 1045   LABSPEC 1.004 (L) 09/23/2018 1045   PHURINE 6.0 09/23/2018 [place] 09/23/2018 [place] 09/23/2018 [place] 09/23/2018 [place] 09/23/2018 [place] 09/23/2018 1045   NITRITE NEGATIVE 09/23/2018 1045   LEUKOCYTESUR SMALL (A) 09/23/2018 1045   Sepsis Labs Invalid input(s): PROCALCITONIN,  WBC,  LACTICIDVEN Microbiology No results found for this or any previous visit (from the past 240 hour(s)).  Time coordinating discharge: 32 mins  SIGNED:  Irwin Brakeman, MD  [facility] 09/25/2018, 2:03 PM How to contact the [facility] Attending or Consulting provider [place] or covering provider during after hours [place], for this patient?  1. Check the care team in [facility] and look for a) attending/consulting [facility] provider listed and b) the [facility] team listed 2. Log into [email or web address] and use [place]'s universal password to access. If you do not have the password, please contact the hospital operator. 3. Locate the [facility] provider you are looking for under [facility] and page to a number that you can be directly reached. 4. If you still have difficulty reaching the provider, please page the [facility] (Director on Call) for the Hospitalists listed on amion for assistance.

## 2018-09-25 NOTE — Progress Notes (Signed)
Patient's IV catheter removed and intact. Pt's IV site clean dry and intact. Discharge instructions reviewed and discussed with patient. All questions were answered and no further questions at this time. Pt in stable condition and in no acute distress at time of discharge.

## 2018-09-25 NOTE — Discharge Instructions (Signed)
IMPORTANT INFORMATION: PAY CLOSE ATTENTION   PHYSICIAN DISCHARGE INSTRUCTIONS  Follow with Primary care provider  Vertis Kelch, NP  and other consultants as instructed your Hospitalist Physician  SEEK MEDICAL CARE OR RETURN TO EMERGENCY ROOM IF SYMPTOMS COME BACK, WORSEN OR NEW PROBLEM DEVELOPS.   Please note: You were cared for by a hospitalist during your hospital stay. Every effort will be made to forward records to your primary care provider.  You can request that your primary care provider send for your hospital records if they have not received them.  Once you are discharged, your primary care physician will handle any further medical issues. Please note that NO REFILLS for any discharge medications will be authorized once you are discharged, as it is imperative that you return to your primary care physician (or establish a relationship with a primary care physician if you do not have one) for your post hospital discharge needs so that they can reassess your need for medications and monitor your lab values.  Please get a complete blood count and chemistry panel checked by your Primary MD at your next visit, and again as instructed by your Primary MD.  Get Medicines reviewed and adjusted: Please take all your medications with you for your next visit with your Primary MD  Laboratory/radiological data: Please request your Primary MD to go over all hospital tests and procedure/radiological results at the follow up, please ask your primary care provider to get all Hospital records sent to his/her office.  In some cases, they will be blood work, cultures and biopsy results pending at the time of your discharge. Please request that your primary care provider follow up on these results.  If you are diabetic, please bring your blood sugar readings with you to your follow up appointment with primary care.    Please call and make your follow up appointments as soon as possible.    Also Note the  following: If you experience worsening of your admission symptoms, develop shortness of breath, life threatening emergency, suicidal or homicidal thoughts you must seek medical attention immediately by calling 911 or calling your MD immediately  if symptoms less severe.  You must read complete instructions/literature along with all the possible adverse reactions/side effects for all the Medicines you take and that have been prescribed to you. Take any new Medicines after you have completely understood and accpet all the possible adverse reactions/side effects.   Do not drive when taking Pain medications or sleeping medications (Benzodiazepines)  Do not take more than prescribed Pain, Sleep and Anxiety Medications. It is not advisable to combine anxiety,sleep and pain medications without talking with your primary care practitioner  Special Instructions: If you have smoked or chewed Tobacco  in the last 2 yrs please stop smoking, stop any regular Alcohol  and or any Recreational drug use.  Wear Seat belts while driving.        Chemical Dependency Chemical dependency is an addiction to drugs or alcohol. People with this addiction repeatedly seek out and use drugs or alcohol despite negative consequences to the health and safety of themselves and others. Addiction changes the way the brain works. Because of these changes, addiction is a chronic condition. The medical term for addiction or chemical dependency is substance use disorder. The disorder can be mild, moderate, or severe. People can be dependent on a range of substances. These include alcohol, prescription medicines, and illegal or street drugs, such as marijuana, heroin, and cocaine. What are the causes?  This condition is caused by the effect that the abused substance has on the brain. What increases the risk? The following factors may make you more likely to develop this condition:  Havinga family history of chemical  dependency.  Having mental health issues, such as depression, anxiety, or bipolar disorder.  Living in an environment where drugs and alcohol are easily available.  Using drugs or alcohol at a young age.  Having friends who use drugs or alcohol.  Having poor social skills.  Tending to be aggressive or impulsive. What are the signs or symptoms? Symptoms may vary depending on the substance that you are addicted to. Symptoms may include the following: Physical Symptoms  The inability to limit the use of drugs or alcohol.  Having nausea, sweating, shakiness, and anxiety when you are not using alcohol or drugs.  Needing a greater amount of drugs or alcohol to get the same effect (developing tolerance).  A change in: ? Sleeping habits. ? Eating or appetite. ? Appearance or how you care for yourself. Emotional Symptoms  Angry outbursts.  Periods of sadness and tearfulness.  Isolation. Relationship Problems  Loved ones suggesting that you have a problem.  Increased fights.  Forgetting commitments.  Having affairs or one-night stands. Problems Related to Irresponsibility  Legal problems.  Irresponsibility with money.  Missing work.  Poor decision making. How is this diagnosed? This condition may be diagnosed based on your symptoms, your medical history, and a physical exam. You may also have blood tests and urine tests. How is this treated? Treatment for this condition depends on the substance that you are addicted to and whether your dependency is mild, moderate, or severe. Treatment options may include:  Stopping substance use safely. This may require taking medicines and being closely observed for several days.  Taking part in group and individual counseling with mental health providers who help people with chemical dependency.  Staying at a residential treatment center for several days or weeks.  Attending daily counseling sessions at a treatment  center.  Taking medicine as told by your health care provider to: ? Ease symptoms and prevent complications during withdrawal. ? Treat other mental health issues, such as depression or anxiety. ? Block cravings by causing the same effects as the substance. ? Block the effects of the substance or replace good sensations with unpleasant ones.  Going to a support group to share your experience with others who are going through the same thing. These groups are an important part of long-term recovery for many people. They include 12-step programs like Alcoholics Anonymous and Narcotics Anonymous.  Recovery can be a long process. Many people who undergo treatment start using the substance again after stopping. This is called a relapse. If you have a relapse, it does not mean that treatment will not work. Follow these instructions at home:  Avoid temptations or triggers that you associate with your use of the substance.  Learn and practice techniques for managing stress.  Have a plan for vulnerable moments.  Get phone numbers of those who are willing to help and who are committed to your recovery.  Know when and where the meetings that you have chosen will occur.  Take over-the-counter and prescription medicines only as told by your health care provider.  Keep all follow-up visits as told by your health care provider. This is important. Contact a health care provider if:  You cannot take your medicines as told.  Your symptoms get worse.  You have trouble resisting  the urge to use drugs or alcohol.  You are in pain, shaking, sweating, or feeling generally unwell.  You are losing weight without trying to. Get help right away if:  You lose consciousness.  Your breathing is slow.  Your pulse is slow or jumpy.  You have serious thoughts about hurting yourself or someone else.  You have a relapse. This information is not intended to replace advice given to you by your health care  provider. Make sure you discuss any questions you have with your health care provider. Document Released: 07/06/2001 Document Revised: 08/18/2015 Document Reviewed: 03/19/2015 Elsevier Interactive Patient Education  2019 [employer].   Alcohol Withdrawal Syndrome When a person who drinks a lot of alcohol stops drinking, he or she may have unpleasant and serious symptoms. These symptoms are called alcohol withdrawal syndrome. This condition may be mild or severe. It can be life-threatening. It can cause:  Shaking that you cannot control (tremor).  Sweating.  Headache.  Feeling fearful, upset, grouchy, or depressed.  Trouble sleeping (insomnia).  Nightmares.  Fast or uneven heartbeats (palpitations).  Alcohol cravings.  Feeling sick to your stomach (nausea).  Throwing up (vomiting).  Being bothered by light and sounds.  Confusion.  Trouble thinking clearly.  Not being hungry (loss of appetite).  Big changes in mood (mood swings). If you have all of the following symptoms at the same time, get help right away:  High blood pressure.  Fast heartbeat.  Trouble breathing.  Seizures.  Seeing, hearing, feeling, smelling, or tasting things that are not there (hallucinations). These symptoms are known as delirium tremens (DTs). They must be treated at the hospital right away. Follow these instructions at home:   Take over-the-counter and prescription medicines only as told by your doctor. This includes vitamins.  Do not drink alcohol.  Do not drive until your doctor says that this is safe for you.  Have someone stay with you or be available in case you need help. This should be someone you trust. This person can help you with your symptoms. He or she can also help you to not drink.  Drink enough fluid to keep your pee (urine) pale yellow.  Think about joining a support group or a treatment program to help you stop drinking.  Keep all follow-up visits as told by  your doctor. This is important. Contact a doctor if:  Your symptoms get worse.  You cannot eat or drink without throwing up.  You have a hard time not drinking alcohol.  You cannot stop drinking alcohol. Get help right away if:  You have fast or uneven heartbeats.  You have chest pain.  You have trouble breathing.  You have a seizure for the first time.  You see, hear, feel, smell, or taste something that is not there.  You get very confused. Summary  When a person who drinks a lot of alcohol stops drinking, he or she may have serious symptoms. This is called alcohol withdrawal syndrome.  Delirium tremens (DTs) is a group of life-threatening symptoms. You should get help right away if you have these symptoms.  Think about joining an alcohol support group or a treatment program. This information is not intended to replace advice given to you by your health care provider. Make sure you discuss any questions you have with your health care provider. Document Released: 12/29/2007 Document Revised: 03/18/2017 Document Reviewed: 03/18/2017 Elsevier Interactive Patient Education  2019 [employer]   Delirium Tremens  Delirium tremens, also known  as DTs, is the worst kind of alcohol withdrawal. Alcohol withdrawal is a group of symptoms. It happens when you stop drinking or you drink less. Delirium tremens is serious, and you need to be treated in a hospital. Follow these instructions at home:  Do not drink alcohol.  Take over-the-counter and prescription medicines only as told by your doctor.  Do not drive or use heavy machinery until your doctor approves.  Drink enough fluid to keep your pee (urine) clear or pale yellow.  Have someone stay with you or be available to you in case you need help.  Think about joining an alcohol support group.  Keep all follow-up visits as told by your doctor. This is important. Contact a doctor if:  You have symptoms that get worse or do  not go away.  You cannot eat or drink without throwing up (vomiting).  You are having a hard time not drinking alcohol.  You cannot stop drinking alcohol. Get help right away if:  You shake and jerk uncontrollably (you have a seizure).  You have a fever plus any other signs of alcohol withdrawal.  You get very confused.  You feel or see things that other people do not feel or see (you hallucinate).  You get very sleepy.  You throw up (vomit) blood.  You shake or tremble very badly (you have tremors).  You sweat a lot.  You feel like your heart is beating out of rhythm or beating too quickly (you have palpitations).  You have chest pain.  You have trouble breathing. These symptoms may be an emergency. Do not wait to see if the symptoms will go away. Get medical help right away. Call your local emergency services (911 in the [place] Do not drive yourself to the hospital. This information is not intended to replace advice given to you by your health care provider. Make sure you discuss any questions you have with your health care provider. Document Released: 11/11/2010 Document Revised: 12/18/2015 Document Reviewed: 01/01/2015 Elsevier Interactive Patient Education  2019 [employer].   Alcohol Use Disorder Alcohol use disorder is when your drinking disrupts your daily life. When you have this condition, you drink too much alcohol and you cannot control your drinking. Alcohol use disorder can cause serious problems with your physical health. It can affect your brain, heart, liver, pancreas, immune system, stomach, and intestines. Alcohol use disorder can increase your risk for certain cancers and cause problems with your mental health, such as depression, anxiety, psychosis, delirium, and dementia. People with this disorder risk hurting themselves and others. What are the causes? This condition is caused by drinking too much alcohol over time. It is not caused by drinking too  much alcohol only one or two times. Some people with this condition drink alcohol to cope with or escape from negative life events. Others drink to relieve pain or symptoms of mental illness. What increases the risk? You are more likely to develop this condition if:  You have a family history of alcohol use disorder.  Your culture encourages drinking to the point of intoxication, or makes alcohol easy to get.  You had a mood or conduct disorder in childhood.  You have been a victim of abuse.  You are an adolescent and: ? You have poor grades or difficulties in school. ? Your caregivers do not talk to you about saying no to alcohol, or supervise your activities. ? You are impulsive or you have trouble with self-control. What are the  signs or symptoms? Symptoms of this condition include:  Drinkingmore than you want to.  Drinking for longer than you want to.  Trying several times to drink less or to control your drinking.  Spending a lot of time getting alcohol, drinking, or recovering from drinking.  Craving alcohol.  Having problems at work, at school, or at home due to drinking.  Having problems in relationships due to drinking.  Drinking when it is dangerous to drink, such as before driving a car.  Continuing to drink even though you know you might have a physical or mental problem related to drinking.  Needing more and more alcohol to get the same effect you want from the alcohol (building up tolerance).  Having symptoms of withdrawal when you stop drinking. Symptoms of withdrawal include: ? Fatigue. ? Nightmares. ? Trouble sleeping. ? Depression. ? Anxiety. ? Fever. ? Seizures. ? Severe confusion. ? Feeling or seeing things that are not there (hallucinations). ? Tremors. ? Rapid heart rate. ? Rapid breathing. ? High blood pressure.  Drinking to avoid symptoms of withdrawal. How is this diagnosed? This condition is diagnosed with an assessment. Your health  care provider may start the assessment by asking three or four questions about your drinking. Your health care provider may perform a physical exam or do lab tests to see if you have physical problems resulting from alcohol use. She or he may refer you to a mental health professional for evaluation. How is this treated? Some people with alcohol use disorder are able to reduce their alcohol use to low-risk levels. Others need to completely quit drinking alcohol. When necessary, mental health professionals with specialized training in substance use treatment can help. Your health care provider can help you decide how severe your alcohol use disorder is and what type of treatment you need. The following forms of treatment are available:  Detoxification. Detoxification involves quitting drinking and using prescription medicines within the first week to help lessen withdrawal symptoms. This treatment is important for people who have had withdrawal symptoms before and for heavy drinkers who are likely to have withdrawal symptoms. Alcohol withdrawal can be dangerous, and in severe cases, it can cause death. Detoxification may be provided in a home, community, or primary care setting, or in a hospital or substance use treatment facility.  Counseling. This treatment is also called talk therapy. It is provided by substance use treatment counselors. A counselor can address the reasons you use alcohol and suggest ways to keep you from drinking again or to prevent problem drinking. The goals of talk therapy are to: ? Find healthy activities and ways for you to cope with stress. ? Identify and avoid the things that trigger your alcohol use. ? Help you learn how to handle cravings.  Medicines.Medicines can help treat alcohol use disorder by: ? Decreasing alcohol cravings. ? Decreasing the positive feeling you have when you drink alcohol. ? Causing an uncomfortable physical reaction when you drink alcohol (aversion  therapy).  Support groups. Support groups are led by people who have quit drinking. They provide emotional support, advice, and guidance. These forms of treatment are often combined. Some people with this condition benefit from a combination of treatments provided by specialized substance use treatment centers. Follow these instructions at home:  Take over-the-counter and prescription medicines only as told by your health care provider.  Check with your health care provider before starting any new medicines.  Ask friends and family members not to offer you alcohol.  Avoid  situations where alcohol is served, including gatherings where others are drinking alcohol.  Create a plan for what to do when you are tempted to use alcohol.  Find hobbies or activities that you enjoy that do not include alcohol.  Keep all follow-up visits as told by your health care provider. This is important. How is this prevented?  If you drink, limit alcohol intake to no more than 1 drink a day for nonpregnant women and 2 drinks a day for men. One drink equals 12 oz of beer, 5 oz of wine, or 1 oz of hard liquor.  If you have a mental health condition, get treatment and support.  Do not give alcohol to adolescents.  If you are an adolescent: ? Do not drink alcohol. ? Do not be afraid to say no if someone offers you alcohol. Speak up about why you do not want to drink. You can be a positive role model for your friends and set a good example for those around you by not drinking alcohol. ? If your friends drink, spend time with others who do not drink alcohol. Make new friends who do not use alcohol. ? Find healthy ways to manage stress and emotions, such as meditation or deep breathing, exercise, spending time in nature, listening to music, or talking with a trusted friend or family member. Contact a health care provider if:  You are not able to take your medicines as told.  Your symptoms get worse.  You  return to drinking alcohol (relapse) and your symptoms get worse. Get help right away if:  You have thoughts about hurting yourself or others. If you ever feel like you may hurt yourself or others, or have thoughts about taking your own life, get help right away. You can go to your nearest emergency department or call:  Your local emergency services (911 in the [place]  A suicide crisis helpline, such as the National Suicide Prevention Lifeline at (458) 616-8732. This is open 24 hours a day. Summary  Alcohol use disorder is when your drinking disrupts your daily life. When you have this condition, you drink too much alcohol and you cannot control your drinking.  Treatment may include detoxification, counseling, medicine, and support groups.  Ask friends and family members not to offer you alcohol. Avoid situations where alcohol is served.  Get help right away if you have thoughts about hurting yourself or others. This information is not intended to replace advice given to you by your health care provider. Make sure you discuss any questions you have with your health care provider. Document Released: 08/19/2004 Document Revised: 04/08/2016 Document Reviewed: 04/08/2016 Elsevier Interactive Patient Education  2019 [employer].   Alcohol Abuse and Nutrition Alcohol abuse is any pattern of alcohol consumption that harms your health, relationships, or work. Alcohol abuse can cause poor nutrition (malnutrition or malnourishment) and a lack of nutrients (nutrient deficiencies), which can lead to more complications. Alcohol abuse brings malnutrition and nutrient deficiencies in two ways:  It causes your liver to work abnormally. This affects how your body divides (breaks down) and absorbs nutrients from food.  It causes you to eat poorly. Many people who abuse alcohol do not eat enough carbohydrates, protein, fat, vitamins, and minerals. Nutrients that are commonly lacking (deficient) in people  who abuse alcohol include:  Vitamins. ? Vitamin A. This is needed for your vision, metabolism, and ability to fight off infections (immunity). ? B vitamins. These include folate, thiamine, and niacin. These are needed for  new cell growth. ? Vitamin C. This plays an important role in wound healing, immunity, and helping your body to absorb iron. ? Vitamin D. This is necessary for your body to absorb and use calcium. It is produced by your liver, but you can also get it from food and from sun exposure.  Minerals. ? Calcium. This is needed for healthy bones as well as heart and blood vessel (cardiovascular) function. ? Iron. This is important for blood, muscle, and nervous system functioning. ? Magnesium. This plays an important role in muscle and nerve function, and it helps to control blood sugar and blood pressure. ? Zinc. This is important for the normal functioning of your nervous system and digestive system (gastrointestinal tract). If you think that you have an alcohol dependency problem, or if it is hard to stop drinking because you feel sick or different when you do not use alcohol, talk with your health care provider or another health professional about where to get help. Nutrition is an essential factor in therapy for alcohol abuse. Your health care provider or diet and nutrition specialist (dietitian) will work with you to design a plan that can help to restore nutrients to your body and prevent the risk of complications. What is my plan? Your dietitian may develop a specific eating plan that is based on your condition and any other problems that you have. An eating plan will commonly include:  A balanced diet. ? Grains: 6-8 oz (170-227 g) a day. Examples of 1 oz of whole grains include 1 cup of whole-wheat cereal,  cup of brown rice, or 1 slice of whole-wheat bread. ? Vegetables: 2-3 cups a day. Examples of 1 cup of vegetables include 2 medium carrots, 1 large tomato, or 2 stalks of  celery. ? Fruits: 1-2 cups a day. Examples of 1 cup of fruit include 1 large banana, 1 small apple, 8 large strawberries, or 1 large orange. ? Meat and other protein: 5-6 oz (142-170 g) a day.  A cut of meat or fish that is the size of a deck of cards is about 3-4 oz.  Foods that provide 1 oz of protein include 1 egg,  cup of nuts or seeds, or 1 tablespoon (16 g) of peanut butter. ? Dairy: 2-3 cups a day. Examples of 1 cup of dairy include 8 oz (230 mL) of milk, 8 oz (230 g) of yogurt, or 1 oz (44 g) of natural cheese.  Vitamin and mineral supplements. What are tips for following this plan?  Eat frequent meals and snacks. Try to eat 5-6 small meals each day.  Take vitamin or mineral supplements as recommended by your dietitian.  If you are malnourished or if your dietitian recommends it: ? You may follow a high-protein, high-calorie diet. This may include:  2,000-3,000 calories (kilocalories) a day.  70-100 g (grams) of protein a day. ? You may be directed to follow a diet that includes a complete nutritional supplement beverage. This can help to restore calories, protein, and vitamins to your body. Depending on your condition, you may be advised to consume this beverage instead of your meals or in addition to them.  Certain medicines may cause changes in your appetite, taste, and weight. Work with your health care provider and dietitian to make any changes to your medicines and eating plan.  If you are unable to take in enough food and calories by mouth, your health care provider may recommend a feeding tube. This tube delivers nutritional supplements  directly to your stomach. Recommended foods  Eat foods that are high in molecules that prevent oxygen from reacting with your food (antioxidants). These foods include grapes, berries, nuts, green tea, and dark green or orange vegetables. Eating these can help to prevent some of the stress that is placed on your liver by consuming  alcohol.  Eat a variety of fresh fruits and vegetables each day. This will help you to get fiber and vitamins in your diet.  Drink plenty of water and other clear fluids, such as apple juice and broth. Try to drink at least 48-64 oz (1.5-2 L) of water a day.  Include foods fortified with vitamins and minerals in your diet. Commonly fortified foods include milk, orange juice, cereal, and bread.  Eat a variety of foods that are high in omega-3 and omega-6 fatty acids. These include fish, nuts and seeds, and soybeans. These foods may help your liver to recover and may also stabilize your mood.  If you are a vegetarian: ? Eat a variety of protein-rich foods. ? Pair whole grains with plant-based proteins at meals and snack time. For example, eat rice with beans, put peanut butter on whole-grain toast, or eat oatmeal with sunflower seeds. The items listed above may not be a complete list of foods and beverages you can eat. Contact a dietitian for more information. Foods to avoid  Avoid foods and drinks that are high in fat and sugar. Sugary drinks, salty snacks, and candy contain empty calories. This means that they lack important nutrients such as protein, fiber, and vitamins.  Avoid alcohol. This is the best way to avoid malnutrition due to alcohol abuse. If you must drink, drink measured amounts. Measured drinking means limiting your intake to no more than 1 drink a day for nonpregnant women and 2 drinks a day for men. One drink equals 12 oz (355 mL) of beer, 5 oz (148 mL) of wine, or 1 oz (44 mL) of hard liquor.  Limit your intake of caffeine. Replace drinks like coffee and black tea with decaffeinated coffee and decaffeinated herbal tea. The items listed above may not be a complete list of foods and beverages you should avoid. Contact a dietitian for more information. Summary  Alcohol abuse can cause poor nutrition (malnutrition or malnourishment) and a lack of nutrients (nutrient  deficiencies), which can lead to more health problems.  Common nutrient deficiencies include vitamin deficiencies (A, B, C, and D) and mineral deficiencies (calcium, iron, magnesium, and zinc).  Nutrition is an essential factor in therapy for alcohol abuse.  Your health care provider and dietitian can help you to develop a specific eating plan that includes a balanced diet plus vitamin and mineral supplements. This information is not intended to replace advice given to you by your health care provider. Make sure you discuss any questions you have with your health care provider. Document Released: 05/06/2005 Document Revised: 03/15/2018 Document Reviewed: 03/29/2017 Elsevier Interactive Patient Education  2019 [employer].

## 2018-12-28 ENCOUNTER — Emergency Department (HOSPITAL_COMMUNITY)
Admission: EM | Admit: 2018-12-28 | Discharge: 2018-12-28 | Disposition: A | Discharge status: Home / Self Care | Payer: Medicaid Other | Attending: Emergency Medicine | Admitting: Emergency Medicine

## 2018-12-28 ENCOUNTER — Encounter (HOSPITAL_COMMUNITY): Payer: Self-pay | Admitting: *Deleted

## 2018-12-28 ENCOUNTER — Other Ambulatory Visit: Payer: Self-pay

## 2018-12-28 DIAGNOSIS — R21 Rash and other nonspecific skin eruption: Secondary | ICD-10-CM | POA: Insufficient documentation

## 2018-12-28 DIAGNOSIS — M79602 Pain in left arm: Secondary | ICD-10-CM

## 2018-12-28 DIAGNOSIS — F17210 Nicotine dependence, cigarettes, uncomplicated: Secondary | ICD-10-CM | POA: Insufficient documentation

## 2018-12-28 DIAGNOSIS — J449 Chronic obstructive pulmonary disease, unspecified: Secondary | ICD-10-CM | POA: Insufficient documentation

## 2018-12-28 DIAGNOSIS — T385X5A Adverse effect of other estrogens and progestogens, initial encounter: Secondary | ICD-10-CM | POA: Diagnosis not present

## 2018-12-28 DIAGNOSIS — Z79899 Other long term (current) drug therapy: Secondary | ICD-10-CM | POA: Diagnosis not present

## 2018-12-28 MED ORDER — DEXAMETHASONE 4 MG PO TABS
10.0000 mg | ORAL_TABLET | Freq: Once | ORAL | Status: AC
  Administered 2018-12-28: 10 mg via ORAL
  Filled 2018-12-28: qty 3

## 2018-12-28 MED ORDER — OXYCODONE-ACETAMINOPHEN 5-325 MG PO TABS
1.0000 | ORAL_TABLET | Freq: Once | ORAL | Status: AC
  Administered 2018-12-28: 02:00:00 1 via ORAL
  Filled 2018-12-28: qty 1

## 2018-12-28 MED ORDER — OXYCODONE-ACETAMINOPHEN 5-325 MG PO TABS: 1.0000 | ORAL_TABLET | ORAL | 0 refills | Status: AC | PRN

## 2018-12-28 MED ORDER — OXYCODONE-ACETAMINOPHEN 5-325 MG PO TABS
1.0000 | ORAL_TABLET | Freq: Once | ORAL | Status: AC
  Administered 2018-12-28: 1 via ORAL
  Filled 2018-12-28: qty 1

## 2018-12-28 NOTE — ED Triage Notes (Signed)
Pt c/o severe pain and redness to left arm; pt states the pain has been getting progressively worse since receiving her depo shot for birth control

## 2018-12-28 NOTE — ED Provider Notes (Signed)
[facility] EMERGENCY DEPARTMENT Provider Note   CSN: [id number] Arrival date & time: 12/28/18  0105    History   Chief Complaint Chief Complaint  Patient presents with  . Allergic Reaction    HPI Alison Johnson is a 33 y.o. female.   The history is provided by the patient.  She has history of COPD, alcohol abuse and comes in because of pain in her left arm following Depo-Provera injection.  She states that she has been on Depo-Provera for a long time.  Today, she got her usual scheduled injection, but that the person administering it was not sure if she was to get the version that is to be injected in the arm or in the thigh.  Following the injection, she stated it hurt more than normal.  Normally the pain gets better following injection, but this time it got progressively worse.  She did have a period of itching, but that has resolved.  She noted a rash on her arm which prompted her to come to the ED.  She denies any difficulty breathing or swallowing.  She has taken ibuprofen without relief.  Past Medical History:  Diagnosis Date  . Alcoholism (HCC)   . Cataract   . Chronic mental illness   . COPD (chronic obstructive pulmonary disease) (HCC)   . Depression   . Fractures   . Gastritis     Patient Active Problem List   Diagnosis Date Noted  . Alcohol abuse   . Protein-calorie malnutrition, severe 11/24/2017  . Hypokalemia 11/23/2017  . Alcohol use 11/23/2017  . Gastritis 11/23/2017  . Nausea and vomiting 11/23/2017  . Macrocytosis without anemia 11/23/2017  . Metabolic acidosis 11/23/2017  . Closed fracture of proximal end of right humerus with routine healing 09/04/17 09/22/2017    Past Surgical History:  Procedure Laterality Date  . CATARACT EXTRACTION    . EYE SURGERY       OB History    Gravida  2   Para      Term      Preterm      AB      Living  2     SAB      TAB      Ectopic      Multiple      Live Births               Home  Medications    Prior to Admission medications   Medication Sig Start Date End Date Taking? Authorizing Provider  folic acid (FOLVITE) 1 MG tablet Take 1 tablet (1 mg total) by mouth daily. 09/26/18   Johnson, Clanford L, MD  medroxyPROGESTERone (DEPO-PROVERA) 150 MG/ML injection Inject 150 mg into the muscle every 3 (three) months.    [provider]  Multiple Vitamin (MULTIVITAMIN WITH MINERALS) TABS tablet Take 1 tablet by mouth daily. 09/26/18   Johnson, Clanford L, MD  pantoprazole (PROTONIX) 40 MG tablet Take 1 tablet (40 mg total) by mouth daily. 11/26/17   Vassie Loll, MD  thiamine 100 MG tablet Take 1 tablet (100 mg total) by mouth daily. 09/26/18   Cleora Fleet, MD    Family History Family History  Problem Relation Age of Onset  . COPD Father   . Depression Father   . Heart attack Father     Social History Social History   Tobacco Use  . Smoking status: Current Every Day Smoker    Packs/day: 1.00    Types: Cigarettes  .  Smokeless tobacco: Never Used  Substance Use Topics  . Alcohol use: Yes    Comment: daily 3-40oz daily  . Drug use: Yes    Types: Marijuana     Allergies   Hydrocodone   Review of Systems Review of Systems  All other systems reviewed and are negative.    Physical Exam Updated Vital Signs BP 106/71   Pulse 68   Temp 98.4 F (36.9 C)   Resp 17   Ht 5\' 2"  (1.575 m)   Wt 57.6 kg   SpO2 99%   BMI 23.23 kg/m   Physical Exam Vitals signs and nursing note reviewed.    33 year old female, crying, but in no acute distress. Vital signs are normal. Oxygen saturation is 99%, which is normal. Head is normocephalic and atraumatic. PERRLA, EOMI. Oropharynx is clear. Neck is nontender and supple without adenopathy or JVD. Back is nontender and there is no CVA tenderness. Lungs are clear without rales, wheezes, or rhonchi. Chest is nontender. Heart has regular rate and rhythm without murmur. Abdomen is soft, flat, nontender  without masses or hepatosplenomegaly and peristalsis is normoactive. Extremities: Petechial appearing rash in the left arm.  This area is very tender, but petechiae are not palpable.  There is no erythema or warmth.  Full passive range of motion is present. Skin is warm and dry without other rash. Neurologic: Mental status is normal, cranial nerves are intact, there are no motor or sensory deficits.  ED Treatments / Results   Procedures Procedures   Medications Ordered in ED Medications  dexamethasone (DECADRON) tablet 10 mg (10 mg Oral Given 12/28/18 0223)  oxyCODONE-acetaminophen (PERCOCET/ROXICET) 5-325 MG per tablet 1 tablet (1 tablet Oral Given 12/28/18 0223)  oxyCODONE-acetaminophen (PERCOCET/ROXICET) 5-325 MG per tablet 1 tablet (1 tablet Oral Given 12/28/18 0420)     Initial Impression / Assessment and Plan / ED Course  I have reviewed the triage vital signs and the nursing notes.  Pertinent labs & imaging results that were available during my care of the patient were reviewed by me and considered in my medical decision making (see chart for details).  Petechial rash following injection of Depo-Provera.  This appears to be a localized reaction.  Will treat symptomatically, but also give a dose of dexamethasone and a dose of oxycodone-acetaminophen.  She only had partial relief of pain with oxycodone-acetaminophen and is given a second dose.  Following this, she is doing much better.  Rash seems to be fading.  She is felt to be safe for discharge.  She is discharged with a take-home pack of oxycodone-acetaminophen, advised to use ibuprofen as needed for less severe pain and take diphenhydramine as needed for itching.  Return if symptoms worsen.  Final Clinical Impressions(s) / ED Diagnoses   Final diagnoses:  Pain in left arm  Rash in adult    ED Discharge Orders         Ordered    oxyCODONE-acetaminophen (PERCOCET) 5-325 MG tablet  Every 4 hours PRN     12/28/18 0528            Dione BoozeGlick, Miko Markwood, MD 12/28/18 [phone]

## 2018-12-28 NOTE — Discharge Instructions (Signed)
Take diphenhydramine (Benadryl) as needed for itching.  Take ibuprofen as needed for pain.  Take oxycodone-acetaminophen as needed for severe pain.  Return if symptoms are getting worse.

## 2018-12-29 MED FILL — Oxycodone w/ Acetaminophen Tab 5-325 MG: ORAL | Qty: 6 | Status: AC

## 2021-01-22 ENCOUNTER — Ambulatory Visit: Payer: Medicaid Other | Admitting: Orthopaedic Surgery

## 2021-03-10 ENCOUNTER — Encounter: Payer: Self-pay | Admitting: Emergency Medicine

## 2021-03-10 ENCOUNTER — Ambulatory Visit (INDEPENDENT_AMBULATORY_CARE_PROVIDER_SITE_OTHER): Payer: Medicaid Other

## 2021-03-10 ENCOUNTER — Other Ambulatory Visit: Payer: Self-pay

## 2021-03-10 ENCOUNTER — Ambulatory Visit
Admission: EM | Admit: 2021-03-10 | Discharge: 2021-03-10 | Disposition: A | Discharge status: Home / Self Care | Payer: Medicaid Other | Attending: Family Medicine | Admitting: Family Medicine

## 2021-03-10 DIAGNOSIS — M25571 Pain in right ankle and joints of right foot: Secondary | ICD-10-CM

## 2021-03-10 DIAGNOSIS — S99911A Unspecified injury of right ankle, initial encounter: Secondary | ICD-10-CM

## 2021-03-10 MED ORDER — DICLOFENAC SODIUM 50 MG PO TBEC: 50.0000 mg | DELAYED_RELEASE_TABLET | Freq: Two times a day (BID) | ORAL | 0 refills | Status: AC

## 2021-03-10 NOTE — Discharge Instructions (Addendum)
Ace wrap well with weightbearing activities. Elevate and apply ice directly to ankle to reduce swelling. Discontinue ibuprofen and start diclofenac 50 mg twice daily as needed for pain and swelling. You may take extra strength Tylenol 1000 mg every 6 hours as needed for pain. If symptoms do not improve follow-up with orthopedic provider listed on your discharge paperwork.

## 2021-03-10 NOTE — ED Provider Notes (Signed)
[facility]    CSN: [id number] Arrival date & time: 03/10/21  1403      History   Chief Complaint No chief complaint on file.   HPI Alison Johnson is a 35 y.o. female.   HPI Patient presents today with right ankle pain following tripping over her dog.  She reports pain with weightbearing. Injury occurred today.  She has taken ibuprofen without relief.  She has not applied ice Concern for possible fracture given pain with weightbearing  Past Medical History:  Diagnosis Date   Alcoholism (HCC)    Cataract    Chronic mental illness    COPD (chronic obstructive pulmonary disease) (HCC)    Depression    Fractures    Gastritis     Patient Active Problem List   Diagnosis Date Noted   Alcohol abuse    Protein-calorie malnutrition, severe 11/24/2017   Hypokalemia 11/23/2017   Alcohol use 11/23/2017   Gastritis 11/23/2017   Nausea and vomiting 11/23/2017   Macrocytosis without anemia 11/23/2017   Metabolic acidosis 11/23/2017   Closed fracture of proximal end of right humerus with routine healing 09/04/17 09/22/2017    Past Surgical History:  Procedure Laterality Date   CATARACT EXTRACTION     EYE SURGERY      OB History     Gravida  2   Para      Term      Preterm      AB      Living  2      SAB      IAB      Ectopic      Multiple      Live Births               Home Medications    Prior to Admission medications   Medication Sig Start Date End Date Taking? Authorizing Provider  folic acid (FOLVITE) 1 MG tablet Take 1 tablet (1 mg total) by mouth daily. 09/26/18   Johnson, Clanford L, MD  medroxyPROGESTERone (DEPO-PROVERA) 150 MG/ML injection Inject 150 mg into the muscle every 3 (three) months.    [provider]  Multiple Vitamin (MULTIVITAMIN WITH MINERALS) TABS tablet Take 1 tablet by mouth daily. 09/26/18   Johnson, Clanford L, MD  oxyCODONE-acetaminophen (PERCOCET) 5-325 MG tablet Take 1-2 tablets by mouth every 4  (four) hours as needed for moderate pain. 12/28/18   Dione Booze, MD  pantoprazole (PROTONIX) 40 MG tablet Take 1 tablet (40 mg total) by mouth daily. 11/26/17   Vassie Loll, MD  thiamine 100 MG tablet Take 1 tablet (100 mg total) by mouth daily. 09/26/18   Cleora Fleet, MD    Family History Family History  Problem Relation Age of Onset   Rheum arthritis Mother    Hypertension Mother    COPD Father    Depression Father    Heart attack Father     Social History Social History   Tobacco Use   Smoking status: Every Day    Packs/day: 1.00    Types: Cigarettes   Smokeless tobacco: Never  Vaping Use   Vaping Use: Never used  Substance Use Topics   Alcohol use: Yes    Comment: daily 3-40oz daily   Drug use: Yes    Types: Marijuana     Allergies   Hydrocodone   Review of Systems Review of Systems Pertinent negatives listed in HPI   Physical Exam Triage Vital Signs ED Triage Vitals  Enc  Vitals Group     BP 03/10/21 1554 108/72     Pulse Rate 03/10/21 1554 65     Resp 03/10/21 1554 16     Temp 03/10/21 1554 98.2 F (36.8 C)     Temp Source 03/10/21 1554 Tympanic     SpO2 03/10/21 1554 98 %     Weight --      Height --      Head Circumference --      Peak Flow --      Pain Score 03/10/21 1557 6     Pain Loc --      Pain Edu? --      Excl. in GC? --    No data found.  Updated Vital Signs BP 108/72 (BP Location: Left Arm)   Pulse 65   Temp 98.2 F (36.8 C) (Tympanic)   Resp 16   SpO2 98%   Visual Acuity Right Eye Distance:   Left Eye Distance:   Bilateral Distance:    Right Eye Near:   Left Eye Near:    Bilateral Near:     Physical Exam General appearance: Alert, well developed, well nourished, cooperative and in no distress Head: Normocephalic, without obvious abnormality, atraumatic Respiratory: Respirations even and unlabored, normal respiratory rate Heart: Rate and rhythm normal. No gallop or murmurs noted on exam  Extremities: Right  ankle no deformity, no ecchymosis, tenderness, trace swelling at the lateral malleolus  Skin: Skin color, texture, turgor normal. No rashes seen  Psych: Appropriate mood and affect. Neurologic: GCS 15, normal coordination normal gait UC Treatments / Results  Labs (all labs ordered are listed, but only abnormal results are displayed) Labs Reviewed - No data to display  EKG   Radiology DG Ankle Complete Right  Result Date: 03/10/2021 CLINICAL DATA:  Ankle pain after tripping. EXAM: RIGHT ANKLE - COMPLETE 3+ VIEW COMPARISON:  None. FINDINGS: There is no evidence of fracture, dislocation, or joint effusion. There is no evidence of arthropathy or other focal bone abnormality. Soft tissues are unremarkable. IMPRESSION: Negative. Electronically Signed   By: Kennith Center M.D.   On: 03/10/2021 16:09    Procedures Procedures (including critical care time)  Medications Ordered in UC Medications - No data to display  Initial Impression / Assessment and Plan / UC Course  I have reviewed the triage vital signs and the nursing notes.  Pertinent labs & imaging results that were available during my care of the patient were reviewed by me and considered in my medical decision making (see chart for details).  Imaging of the right ankle negative for fracture. Ace wrap applied RICE recommended Diclofenac twice daily as needed Tylenol as needed Ortho follow-up if no improvement    Final Clinical Impressions(s) / UC Diagnoses   Final diagnoses:  Injury of right ankle, initial encounter   Discharge Instructions   None    ED Prescriptions   None    PDMP not reviewed this encounter.   Bing Neighbors, FNP 03/10/21 [phone]

## 2021-03-10 NOTE — ED Triage Notes (Signed)
Tripped over dog today.  Pain in right ankle and foot.

## 2021-10-31 IMAGING — DX DG ANKLE COMPLETE 3+V*R*
3 series · 3 of 3 positions shown · non-contrast
Comparison: None.

CLINICAL DATA: Ankle pain after tripping.

EXAM:
RIGHT ANKLE - COMPLETE 3+ VIEW

[ankle ap]
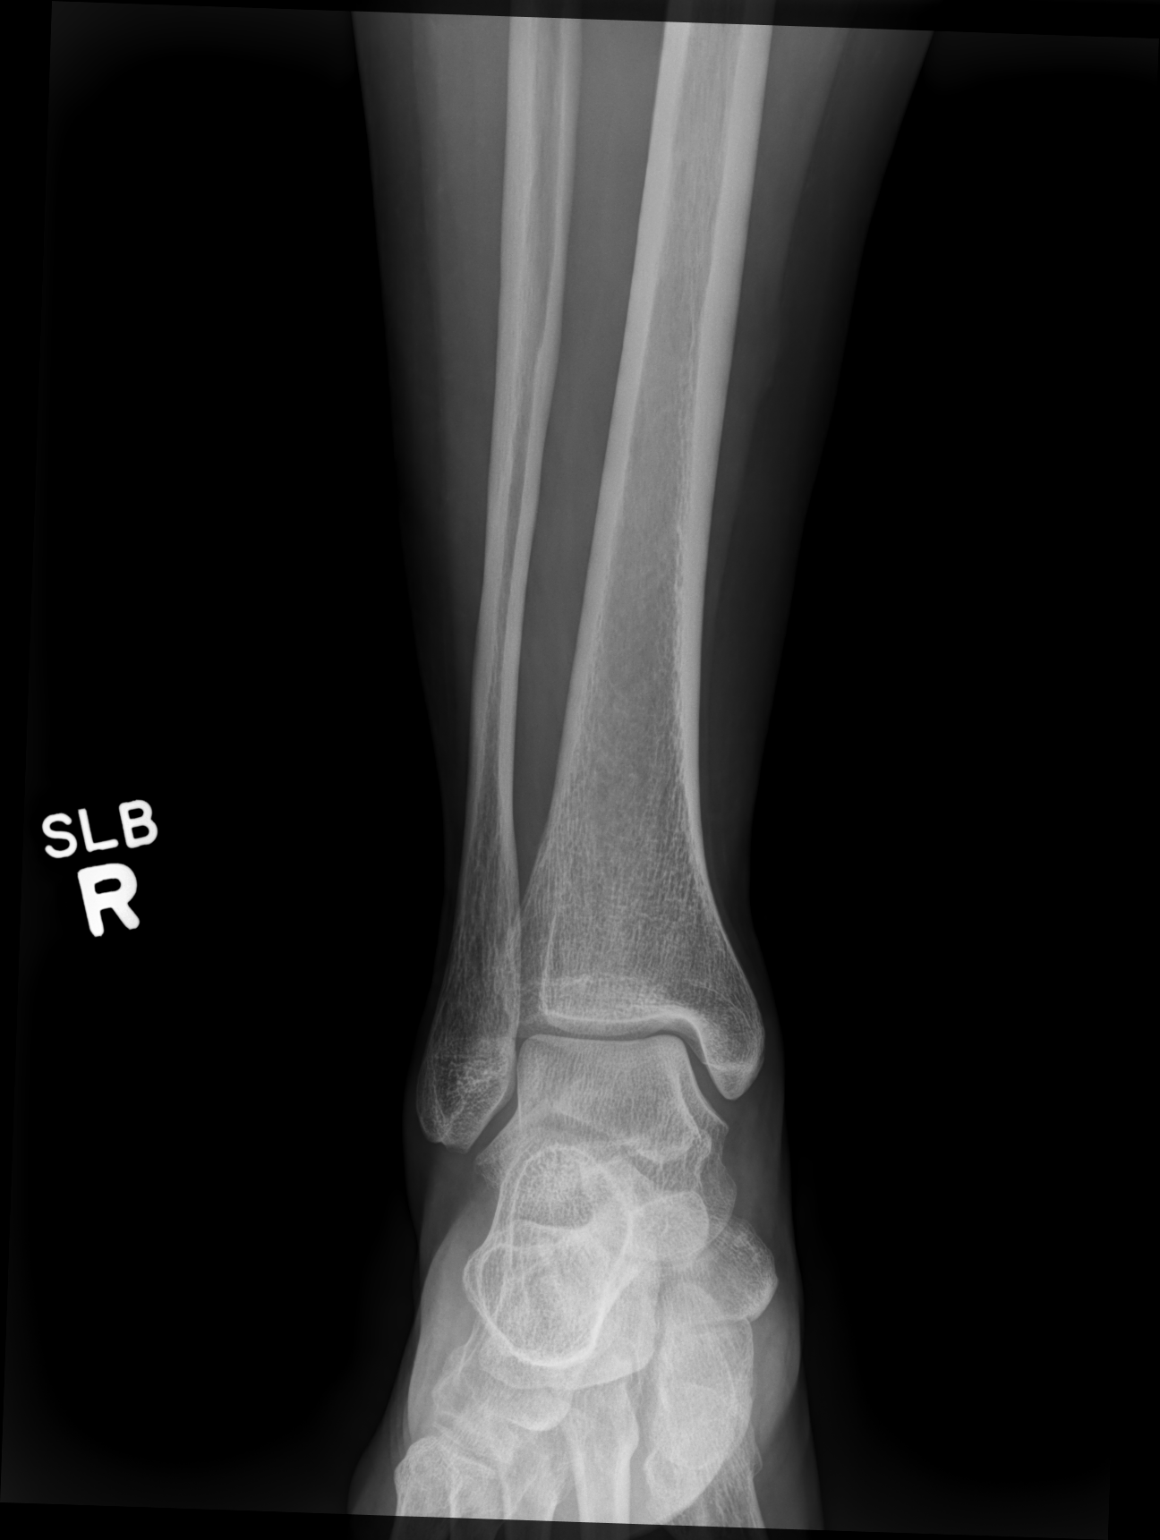

[ankle mlo]
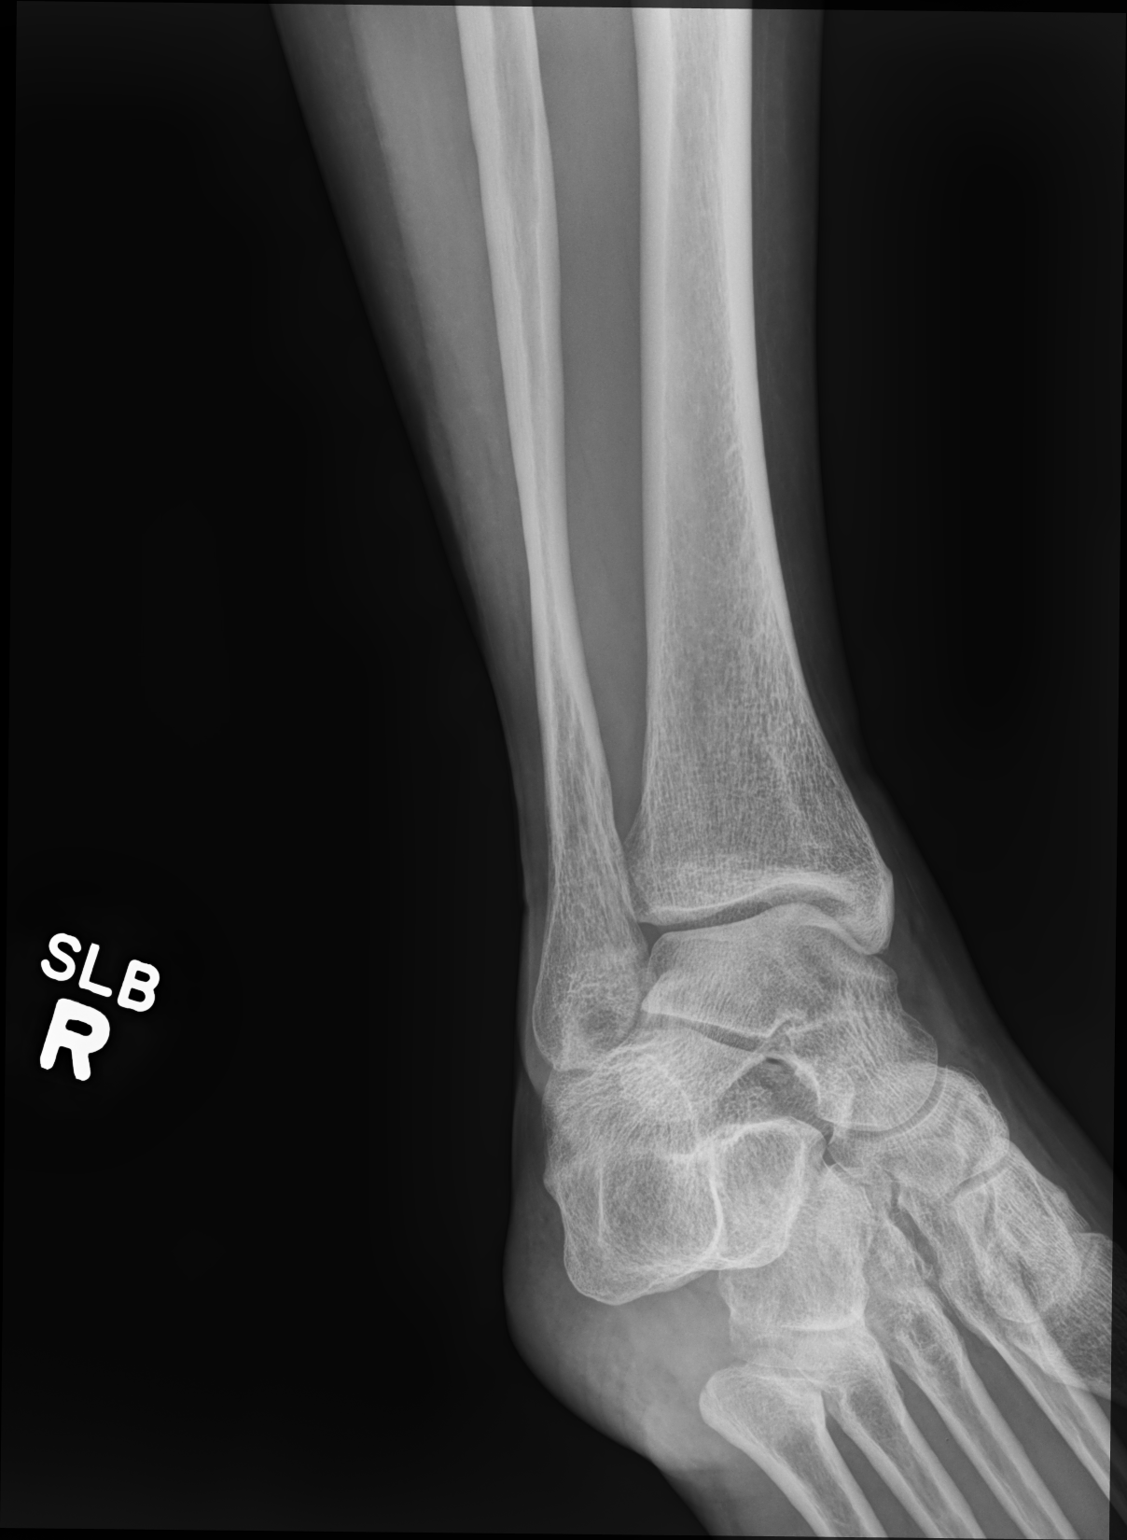

[ankle lat]
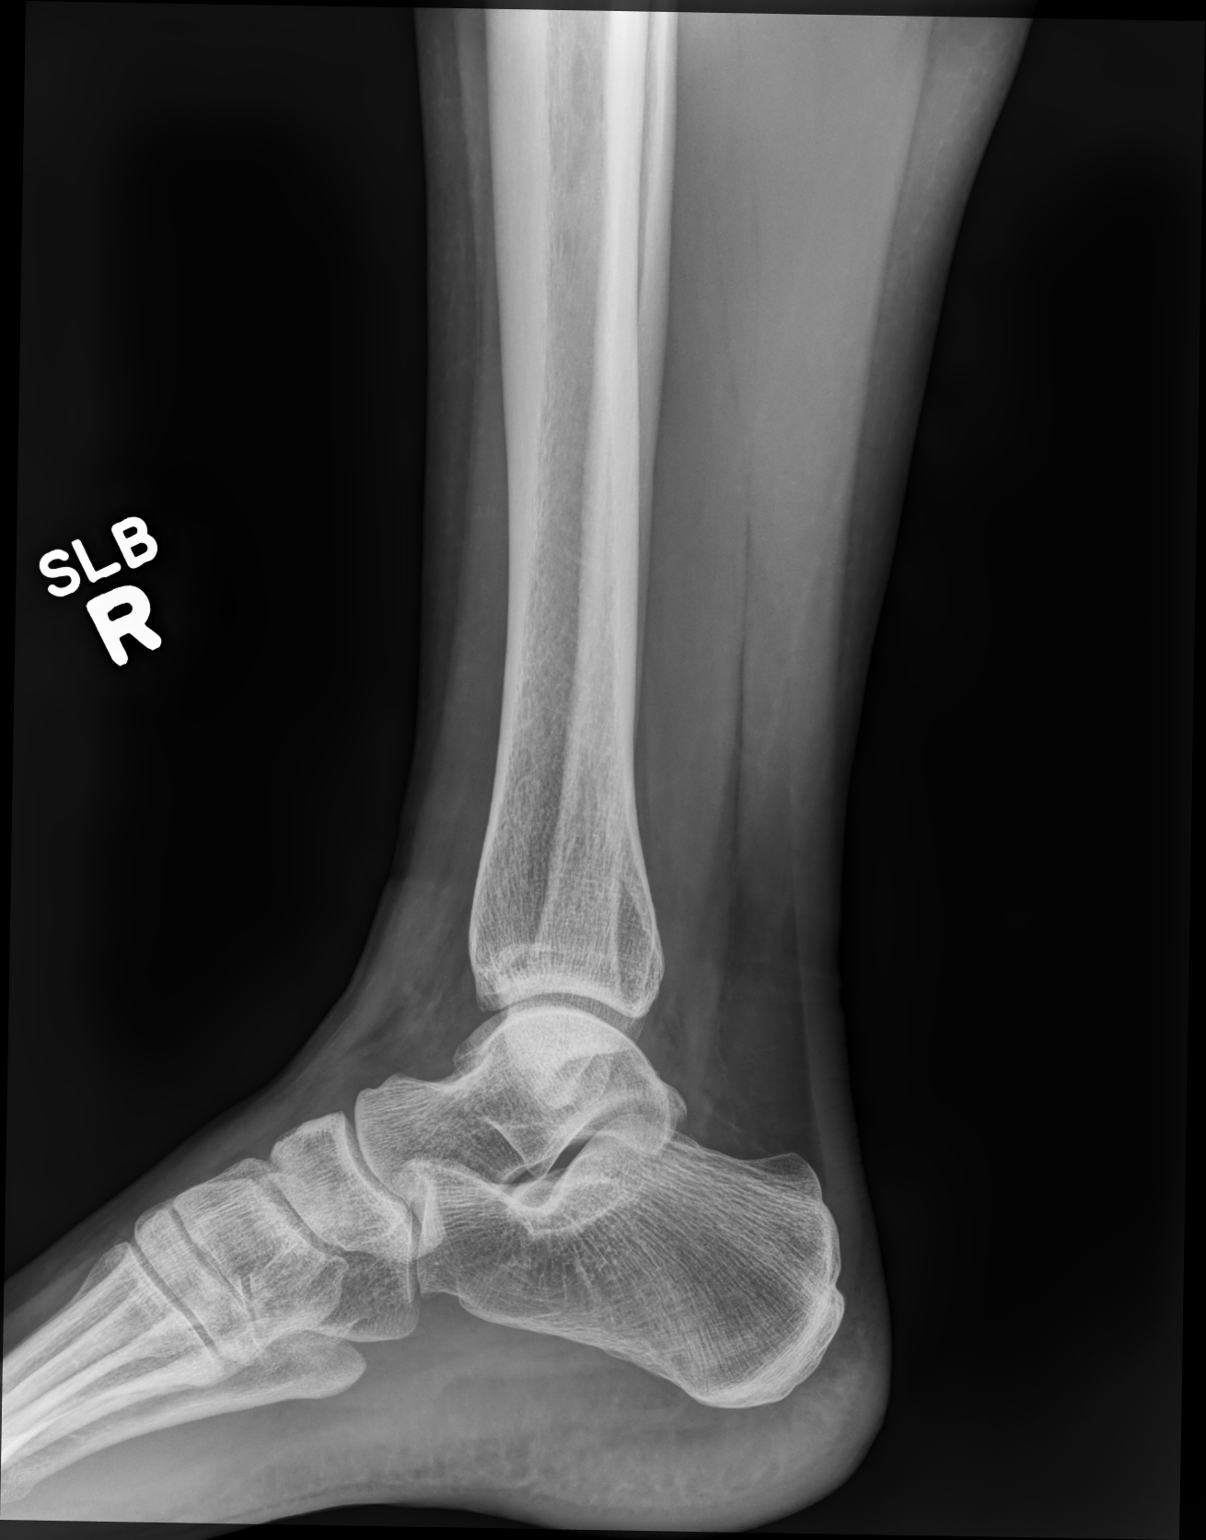

[3 of 3 positions shown; findings below may reference images not displayed]

FINDINGS: There is no evidence of fracture, dislocation, or joint effusion.
There is no evidence of arthropathy or other focal bone abnormality.
Soft tissues are unremarkable.
IMPRESSION: Negative.

## 2022-02-13 ENCOUNTER — Encounter: Payer: Self-pay | Admitting: Emergency Medicine

## 2022-02-13 ENCOUNTER — Ambulatory Visit
Admission: EM | Admit: 2022-02-13 | Discharge: 2022-02-13 | Disposition: A | Discharge status: Home / Self Care | Payer: Medicaid Other | Attending: Nurse Practitioner | Admitting: Nurse Practitioner

## 2022-02-13 DIAGNOSIS — K0889 Other specified disorders of teeth and supporting structures: Secondary | ICD-10-CM | POA: Diagnosis not present

## 2022-02-13 MED ORDER — AMOXICILLIN 500 MG PO CAPS: 500.0000 mg | ORAL_CAPSULE | Freq: Three times a day (TID) | ORAL | 0 refills | Status: AC

## 2022-02-13 MED ORDER — IBUPROFEN 800 MG PO TABS: 800.0000 mg | ORAL_TABLET | Freq: Three times a day (TID) | ORAL | 0 refills | Status: AC | PRN

## 2022-02-13 NOTE — ED Provider Notes (Signed)
[facility]    CSN: [id number] Arrival date & time: 02/13/22  1235      History   Chief Complaint No chief complaint on file.   HPI Alison Johnson is a 36 y.o. female.   The history is provided by the patient.   Patient presents for complaints of dental pain that has been present for the past 2 days.  Patient reports a history of dental caries and missing or broken teeth.  Patient states that since her symptoms started, she has had right-sided facial swelling, pain with talking, swallowing, chewing, or eating.  She denies fever, chills, chest pain, abdominal pain nausea, vomiting, or diarrhea.  Past Medical History:  Diagnosis Date   Alcoholism (HCC)    Cataract    Chronic mental illness    COPD (chronic obstructive pulmonary disease) (HCC)    Depression    Fractures    Gastritis     Patient Active Problem List   Diagnosis Date Noted   Alcohol abuse    Protein-calorie malnutrition, severe 11/24/2017   Hypokalemia 11/23/2017   Alcohol use 11/23/2017   Gastritis 11/23/2017   Nausea and vomiting 11/23/2017   Macrocytosis without anemia 11/23/2017   Metabolic acidosis 11/23/2017   Closed fracture of proximal end of right humerus with routine healing 09/04/17 09/22/2017    Past Surgical History:  Procedure Laterality Date   CATARACT EXTRACTION     EYE SURGERY      OB History     Gravida  2   Para      Term      Preterm      AB      Living  2      SAB      IAB      Ectopic      Multiple      Live Births               Home Medications    Prior to Admission medications   Medication Sig Start Date End Date Taking? Authorizing Provider  amoxicillin (AMOXIL) 500 MG capsule Take 1 capsule (500 mg total) by mouth 3 (three) times daily. 02/13/22  Yes Mimie Goering-Warren, Sadie Haber, NP  ibuprofen (ADVIL) 800 MG tablet Take 1 tablet (800 mg total) by mouth every 8 (eight) hours as needed. 02/13/22  Yes Aliany Fiorenza-Warren, Sadie Haber, NP   diclofenac (VOLTAREN) 50 MG EC tablet Take 1 tablet (50 mg total) by mouth 2 (two) times daily. 03/10/21   Bing Neighbors, FNP  folic acid (FOLVITE) 1 MG tablet Take 1 tablet (1 mg total) by mouth daily. 09/26/18   Johnson, Clanford L, MD  medroxyPROGESTERone (DEPO-PROVERA) 150 MG/ML injection Inject 150 mg into the muscle every 3 (three) months.    [provider]  Multiple Vitamin (MULTIVITAMIN WITH MINERALS) TABS tablet Take 1 tablet by mouth daily. 09/26/18   Johnson, Clanford L, MD  oxyCODONE-acetaminophen (PERCOCET) 5-325 MG tablet Take 1-2 tablets by mouth every 4 (four) hours as needed for moderate pain. 12/28/18   Dione Booze, MD  pantoprazole (PROTONIX) 40 MG tablet Take 1 tablet (40 mg total) by mouth daily. 11/26/17   Vassie Loll, MD  thiamine 100 MG tablet Take 1 tablet (100 mg total) by mouth daily. 09/26/18   Cleora Fleet, MD    Family History Family History  Problem Relation Age of Onset   Rheum arthritis Mother    Hypertension Mother    COPD Father    Depression Father  Heart attack Father     Social History Social History   Tobacco Use   Smoking status: Every Day    Packs/day: 1.00    Types: Cigarettes   Smokeless tobacco: Never  Vaping Use   Vaping Use: Never used  Substance Use Topics   Alcohol use: Yes    Comment: daily 3-40oz daily   Drug use: Yes    Types: Marijuana     Allergies   Hydrocodone   Review of Systems Review of Systems Per HPI  Physical Exam Triage Vital Signs ED Triage Vitals  Enc Vitals Group     BP 02/13/22 1402 95/62     Pulse Rate 02/13/22 1402 63     Resp 02/13/22 1402 18     Temp 02/13/22 1402 98.4 F (36.9 C)     Temp Source 02/13/22 1402 Oral     SpO2 02/13/22 1402 99 %     Weight --      Height --      Head Circumference --      Peak Flow --      Pain Score 02/13/22 1403 7     Pain Loc --      Pain Edu? --      Excl. in GC? --    No data found.  Updated Vital Signs BP 95/62 (BP Location:  Right Arm)   Pulse 63   Temp 98.4 F (36.9 C) (Oral)   Resp 18   SpO2 99%   Visual Acuity Right Eye Distance:   Left Eye Distance:   Bilateral Distance:    Right Eye Near:   Left Eye Near:    Bilateral Near:     Physical Exam Vitals and nursing note reviewed.  Constitutional:      General: She is not in acute distress.    Appearance: Normal appearance.  HENT:     Head: Normocephalic.     Mouth/Throat:     Lips: Pink.     Dentition: Abnormal dentition. Dental tenderness, gingival swelling and dental caries present.     Pharynx: Oropharynx is clear.      Comments: Tooth numbers 31 and 32 with dental caries.  Gingival swelling and erythema present. Eyes:     Extraocular Movements: Extraocular movements intact.     Pupils: Pupils are equal, round, and reactive to light.  Pulmonary:     Effort: Pulmonary effort is normal.  Musculoskeletal:     Cervical back: Normal range of motion.  Skin:    General: Skin is warm and dry.  Neurological:     General: No focal deficit present.     Mental Status: She is alert and oriented to person, place, and time.  Psychiatric:        Mood and Affect: Mood normal.        Behavior: Behavior normal.      UC Treatments / Results  Labs (all labs ordered are listed, but only abnormal results are displayed) Labs Reviewed - No data to display  EKG   Radiology No results found.  Procedures Procedures (including critical care time)  Medications Ordered in UC Medications - No data to display  Initial Impression / Assessment and Plan / UC Course  I have reviewed the triage vital signs and the nursing notes.  Pertinent labs & imaging results that were available during my care of the patient were reviewed by me and considered in my medical decision making (see chart for details).  Patient presents with  dental pain has been present for the past 2 days.  On exam, tooth #s 31 and 32 are broken with dental caries, and facial swelling on  the right side of face.  Symptoms are consistent with dentalgia and dental abscess.  We will start patient on amoxicillin for 7 days, along with ibuprofen for pain.  Patient was advised to start Tylenol in conjunction with ibuprofen to help with pain control.  Supportive care recommendations were provided to the patient.  Dental resource guide was also provided to the patient and encourage follow-up with a dentist within the next 7 to 10 days.  Patient advised to follow-up as needed.  Final Clinical Impressions(s) / UC Diagnoses   Final diagnoses:  None     Discharge Instructions      Take medication as prescribed. May take over-the-counter Tylenol extra strength 1 to 2 hours after ibuprofen for breakthrough pain. Warm salt water gargles 3-4 times daily until symptoms improve. Continue warm compresses to the affected area to help with pain and discomfort. I have provided a dental resource guide for you to follow-up with a dentist.  Recommend following up within the next 7 to 10 days.     ED Prescriptions     Medication Sig Dispense Auth. Provider   amoxicillin (AMOXIL) 500 MG capsule Take 1 capsule (500 mg total) by mouth 3 (three) times daily. 21 capsule Johnesha Acheampong-Warren, Sadie Haber, NP   ibuprofen (ADVIL) 800 MG tablet Take 1 tablet (800 mg total) by mouth every 8 (eight) hours as needed. 30 tablet Amitai Delaughter-Warren, Sadie Haber, NP      PDMP not reviewed this encounter.   Abran Cantor, NP 02/13/22 1454

## 2022-02-13 NOTE — Discharge Instructions (Signed)
  Take medication as prescribed. May take over-the-counter Tylenol extra strength 1 to 2 hours after ibuprofen for breakthrough pain. Warm salt water gargles 3-4 times daily until symptoms improve. Continue warm compresses to the affected area to help with pain and discomfort. I have provided a dental resource guide for you to follow-up with a dentist.  Recommend following up within the next 7 to 10 days.  

## 2022-02-13 NOTE — ED Triage Notes (Signed)
Dental pain on bottom right side of mouth x 2 days.

## 2023-04-06 ENCOUNTER — Other Ambulatory Visit (HOSPITAL_COMMUNITY): Payer: Self-pay | Admitting: Nurse Practitioner

## 2023-04-06 DIAGNOSIS — N63 Unspecified lump in unspecified breast: Secondary | ICD-10-CM

## 2023-05-10 ENCOUNTER — Inpatient Hospital Stay (HOSPITAL_COMMUNITY): Admission: RE | Admit: 2023-05-10 | Payer: Medicaid Other | Source: Ambulatory Visit

## 2023-05-10 ENCOUNTER — Ambulatory Visit (HOSPITAL_COMMUNITY): Admission: RE | Admit: 2023-05-10 | Payer: Medicaid Other | Source: Ambulatory Visit

## 2023-05-10 ENCOUNTER — Ambulatory Visit (HOSPITAL_COMMUNITY): Payer: Medicaid Other | Attending: Nurse Practitioner

## 2023-05-10 ENCOUNTER — Encounter (HOSPITAL_COMMUNITY): Payer: Self-pay
